# Patient Record
Sex: Female | Born: 1995 | Race: White | Hispanic: No | Marital: Single | Smoking: Former smoker
Health system: Southern US, Community
[De-identification: ages and names within clinical notes are randomized; demographics above are authoritative.]

## PROBLEM LIST (undated history)

## (undated) DIAGNOSIS — J45909 Unspecified asthma, uncomplicated: Secondary | ICD-10-CM

## (undated) DIAGNOSIS — O24419 Gestational diabetes mellitus in pregnancy, unspecified control: Secondary | ICD-10-CM

## (undated) HISTORY — PX: WISDOM TOOTH EXTRACTION: SHX21

## (undated) HISTORY — DX: Gestational diabetes mellitus in pregnancy, unspecified control: O24.419

---
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
## 2017-01-09 ENCOUNTER — Other Ambulatory Visit: Payer: Self-pay

## 2017-01-09 ENCOUNTER — Ambulatory Visit (INDEPENDENT_AMBULATORY_CARE_PROVIDER_SITE_OTHER): Payer: Medicaid Other | Admitting: Certified Nurse Midwife

## 2017-01-09 ENCOUNTER — Encounter: Payer: Self-pay | Admitting: Certified Nurse Midwife

## 2017-01-09 DIAGNOSIS — O26893 Other specified pregnancy related conditions, third trimester: Secondary | ICD-10-CM

## 2017-01-09 DIAGNOSIS — N949 Unspecified condition associated with female genital organs and menstrual cycle: Secondary | ICD-10-CM

## 2017-01-09 DIAGNOSIS — R7989 Other specified abnormal findings of blood chemistry: Secondary | ICD-10-CM

## 2017-01-09 DIAGNOSIS — Z3400 Encounter for supervision of normal first pregnancy, unspecified trimester: Secondary | ICD-10-CM

## 2017-01-09 DIAGNOSIS — O24419 Gestational diabetes mellitus in pregnancy, unspecified control: Secondary | ICD-10-CM

## 2017-01-09 MED ORDER — GLYBURIDE 2.5 MG PO TABS: 2.5000 mg | ORAL_TABLET | Freq: Every day | ORAL | 2 refills | Status: DC

## 2017-01-09 MED ORDER — COMFORT FIT MATERNITY SUPP LG MISC: 1.0000 [IU] | Freq: Every day | 0 refills | Status: DC

## 2017-01-09 NOTE — Progress Notes (Addendum)
   PRENATAL VISIT NOTE  Subjective:  Carol MixerCarrie L Lussier is a 21 y.o. G1P0 at 5835w1d being seen today for ongoing prenatal care.  She is currently monitored for the following issues for this high-risk pregnancy and has Seizure-like activity (HCC); Acanthosis nigricans; Morbid obesity (HCC); Prediabetes; Episodic tension-type headache, not intractable; Encounter for supervision of normal pregnancy, unspecified, unspecified trimester; Asthma; Low vitamin D level; Maternal varicella, non-immune; Maternal obesity affecting pregnancy, antepartum; Gestational diabetes; and Impaired glucose tolerance test on their problem list.  Patient reports backache, no bleeding, no contractions, no cramping, no leaking and pelvic pressure with walking.  Contractions: Irritability. Vag. Bleeding: None.  Movement: Present. Denies leaking of fluid.   The following portions of the patient's history were reviewed and updated as appropriate: allergies, current medications, past family history, past medical history, past social history, past surgical history and problem list. Problem list updated.  Objective:  There were no vitals filed for this visit.  Fetal Status: Fetal Heart Rate (bpm): 145; doppler Fundal Height: 34 cm Movement: Present     General:  Alert, oriented and cooperative. Patient is in no acute distress.  Skin: Skin is warm and dry. No rash noted.   Cardiovascular: Normal heart rate noted  Respiratory: Normal respiratory effort, no problems with respiration noted  Abdomen: Soft, gravid, appropriate for gestational age.  Pain/Pressure: Present     Pelvic: Cervical exam deferred        Extremities: Normal range of motion.  Edema: None  Mental Status:  Normal mood and affect. Normal behavior. Normal judgment and thought content.     CBGs    Fastings: mostly elevated: range from 82-107; 6/12 elevated >95    2 hour PP: mostly in range: 89-129; 137 & 144 X1 each  Assessment and Plan:  Pregnancy: G1P0 at  4235w1d  1. Gestational diabetes mellitus (GDM) in third trimester, gestational diabetes method of control unspecified     Started on 2.5 mg of Glyburide at HS today.  - Elastic Bandages & Supports (COMFORT FIT MATERNITY SUPP LG) MISC; 1 Units daily by Does not apply route.  Dispense: 1 each; Refill: 0 - glyBURIDE (DIABETA) 2.5 MG tablet; Take 1 tablet (2.5 mg total) at bedtime by mouth.  Dispense: 30 tablet; Refill: 2  2. Low vitamin D level     Taking weekly vitamin D  3. Supervision of high risk first pregnancy, antepartum        4. Morbid obesity (HCC)     9lb weight gain this pregnancy - Elastic Bandages & Supports (COMFORT FIT MATERNITY SUPP LG) MISC; 1 Units daily by Does not apply route.  Dispense: 1 each; Refill: 0  5. Pelvic pressure in pregnancy, antepartum, third trimester      - Elastic Bandages & Supports (COMFORT FIT MATERNITY SUPP LG) MISC; 1 Units daily by Does not apply route.  Dispense: 1 each; Refill: 0  Preterm labor symptoms and general obstetric precautions including but not limited to vaginal bleeding, contractions, leaking of fluid and fetal movement were reviewed in detail with the patient. Please refer to After Visit Summary for other counseling recommendations.  [facility] (around 01/16/2017[facility], Start Bi-weekly Antenatal testing next week with NSTs twice weekly and AFI once weekly.   Roe Coombsachelle A Danaye Sobh, CNM

## 2017-01-17 ENCOUNTER — Encounter: Payer: Self-pay | Admitting: Obstetrics and Gynecology

## 2017-01-17 ENCOUNTER — Other Ambulatory Visit: Payer: Medicaid Other

## 2017-01-17 ENCOUNTER — Ambulatory Visit (INDEPENDENT_AMBULATORY_CARE_PROVIDER_SITE_OTHER): Payer: Medicaid Other | Admitting: Obstetrics and Gynecology

## 2017-01-17 VITALS — BP 108/68 | HR 93 | Wt 277.0 lb

## 2017-01-17 DIAGNOSIS — O24415 Gestational diabetes mellitus in pregnancy, controlled by oral hypoglycemic drugs: Secondary | ICD-10-CM

## 2017-01-17 DIAGNOSIS — Z3480 Encounter for supervision of other normal pregnancy, unspecified trimester: Secondary | ICD-10-CM

## 2017-01-17 DIAGNOSIS — Z283 Underimmunization status: Secondary | ICD-10-CM

## 2017-01-17 DIAGNOSIS — O09899 Supervision of other high risk pregnancies, unspecified trimester: Secondary | ICD-10-CM

## 2017-01-17 NOTE — Progress Notes (Signed)
   PRENATAL VISIT NOTE  Subjective:  Carol Zhang is a 21 y.o. G1P0 at 5158w2d being seen today for ongoing prenatal care.  She is currently monitored for the following issues for this high-risk pregnancy and has Seizure-like activity (HCC); Acanthosis nigricans; Morbid obesity (HCC); Prediabetes; Episodic tension-type headache, not intractable; Encounter for supervision of normal pregnancy, unspecified, unspecified trimester; Asthma; Low vitamin D level; Maternal varicella, non-immune; Maternal obesity affecting pregnancy, antepartum; Gestational diabetes; and Impaired glucose tolerance test on their problem list.  Patient reports occasional contractions, nothing regular or uncomfortable.  Contractions: Irregular. Vag. Bleeding: None.  Movement: Present. Denies leaking of fluid.   Glyburide 2.5 mg FG: mostly < 95, a couple 96/97 PP: mostly < 120, a couple higher  The following portions of the patient's history were reviewed and updated as appropriate: allergies, current medications, past family history, past medical history, past social history, past surgical history and problem list. Problem list updated.  Objective:   Vitals:   01/17/17 1327 01/17/17 1344  BP: (!) 146/81 108/68  Pulse: 93   Weight: 277 lb (125.6 kg)     Fetal Status: Fetal Heart Rate (bpm): NST/AFI    Movement: Present     General:  Alert, oriented and cooperative. Patient is in no acute distress.  Skin: Skin is warm and dry. No rash noted.   Cardiovascular: Normal heart rate noted  Respiratory: Normal respiratory effort, no problems with respiration noted  Abdomen: Soft, gravid, appropriate for gestational age.  Pain/Pressure: Absent     Pelvic: Cervical exam deferred        Extremities: Normal range of motion.  Edema: None  Mental Status:  Normal mood and affect. Normal behavior. Normal judgment and thought content.   Assessment and Plan:  Pregnancy: G1P0 at 158w2d  1. Supervision of other normal pregnancy,  antepartum Undecided regarding birth control  2. Gestational diabetes mellitus (GDM) in third trimester controlled Glyburide 2.5 mg QHS Generally well controlled with occasional outlier Cont same dose, stressed importance of adhering to diet - Fetal nonstress test - US OB Limited NST reactive, AFI 14  Preterm labor symptoms and general obstetric precautions including but not limited to vaginal bleeding, contractions, leaking of fluid and fetal movement were reviewed in detail with the patient. Please refer to After Visit Summary for other counseling recommendations.  Return in about 1 week (around 01/24/2017) for OB visit (MD).   Conan BowensKelly M Davis, MD

## 2017-01-17 NOTE — Addendum Note (Signed)
Addended by: Leroy LibmanAVIS, KELLY on: 01/17/2017 02:37 PM   Modules accepted: Orders

## 2017-01-17 NOTE — Progress Notes (Signed)
Pt presents for ROB/NST/AFI dx: GDM oral meds. CBG readings available today.

## 2017-01-20 ENCOUNTER — Ambulatory Visit (INDEPENDENT_AMBULATORY_CARE_PROVIDER_SITE_OTHER): Payer: Medicaid Other

## 2017-01-20 VITALS — BP 120/72 | HR 85 | Wt 282.0 lb

## 2017-01-20 DIAGNOSIS — O24415 Gestational diabetes mellitus in pregnancy, controlled by oral hypoglycemic drugs: Secondary | ICD-10-CM

## 2017-01-20 NOTE — Progress Notes (Signed)
Pt presents for NST only today dx: GDM. Reactive - reviewed with MD

## 2017-01-24 ENCOUNTER — Other Ambulatory Visit: Payer: Medicaid Other

## 2017-01-24 ENCOUNTER — Ambulatory Visit (INDEPENDENT_AMBULATORY_CARE_PROVIDER_SITE_OTHER): Payer: Medicaid Other | Admitting: Obstetrics & Gynecology

## 2017-01-24 VITALS — BP 139/91 | HR 102 | Wt 282.0 lb

## 2017-01-24 DIAGNOSIS — O24415 Gestational diabetes mellitus in pregnancy, controlled by oral hypoglycemic drugs: Secondary | ICD-10-CM | POA: Diagnosis not present

## 2017-01-24 DIAGNOSIS — Z3400 Encounter for supervision of normal first pregnancy, unspecified trimester: Secondary | ICD-10-CM

## 2017-01-24 NOTE — Progress Notes (Signed)
   PRENATAL VISIT NOTE  Subjective:  Carol Zhang is a 21 y.o. G1P0 at 835w2d being seen today for ongoing prenatal care.  She is currently monitored for the following issues for this high-risk pregnancy and has Seizure-like activity (HCC); Acanthosis nigricans; Morbid obesity (HCC); Prediabetes; Episodic tension-type headache, not intractable; Encounter for supervision of normal pregnancy, unspecified, unspecified trimester; Asthma; Low vitamin D level; Maternal varicella, non-immune; Maternal obesity affecting pregnancy, antepartum; Gestational diabetes; and Impaired glucose tolerance test on their problem list.  Patient reports no complaints.  Contractions: Not present. Vag. Bleeding: None.  Movement: Present. Denies leaking of fluid.   The following portions of the patient's history were reviewed and updated as appropriate: allergies, current medications, past family history, past medical history, past social history, past surgical history and problem list. Problem list updated.  Objective:   Vitals:   01/24/17 0831  BP: (!) 139/91  Pulse: (!) 102  Weight: 282 lb (127.9 kg)    Fetal Status: Fetal Heart Rate (bpm): NST/AFI   Movement: Present     General:  Alert, oriented and cooperative. Patient is in no acute distress.  Skin: Skin is warm and dry. No rash noted.   Cardiovascular: Normal heart rate noted  Respiratory: Normal respiratory effort, no problems with respiration noted  Abdomen: Soft, gravid, appropriate for gestational age.  Pain/Pressure: Absent     Pelvic: Cervical exam deferred        Extremities: Normal range of motion.  Edema: None  Mental Status:  Normal mood and affect. Normal behavior. Normal judgment and thought content.   Assessment and Plan:  Pregnancy: G1P0 at 655w2d  1. Supervision of normal first pregnancy, antepartum NST today  2. Gestational diabetes mellitus (GDM) in third trimester controlled on oral hypoglycemic drug  - Fetal nonstress test -  US OB Limited  Preterm labor symptoms and general obstetric precautions including but not limited to vaginal bleeding, contractions, leaking of fluid and fetal movement were reviewed in detail with the patient. Please refer to After Visit Summary for other counseling recommendations.  F/u is scheduled, f/u US in 2 weeks  Scheryl DarterJames Arnold, MD

## 2017-01-27 ENCOUNTER — Ambulatory Visit (INDEPENDENT_AMBULATORY_CARE_PROVIDER_SITE_OTHER): Payer: Medicaid Other | Admitting: *Deleted

## 2017-01-27 DIAGNOSIS — Z3400 Encounter for supervision of normal first pregnancy, unspecified trimester: Secondary | ICD-10-CM

## 2017-01-27 DIAGNOSIS — O24415 Gestational diabetes mellitus in pregnancy, controlled by oral hypoglycemic drugs: Secondary | ICD-10-CM

## 2017-01-27 NOTE — Progress Notes (Addendum)
NST: + accels, no decels, moderate variability, Cat. 1 tracing. No contractions on toco.  Agree with nursing staff's documentation of this patient's clinic encounter.  Roe CoombsRachelle A Erasmus Bistline, CNM 01/27/2017 9:27 PM

## 2017-01-27 NOTE — Patient Instructions (Signed)

## 2017-01-27 NOTE — Progress Notes (Signed)
Pt is in office today for NST due to DM.  Pt states she is doing well today. Pt states she has had a cough recently. Pt states no other symptoms at this time. Pt advised of OTC medication that she may take during pregnancy.  Pt advised that if no relief with OTC meds to contact office for further recommendations. Pt states understanding.   NST was reviewed with R.Denney, CNM - reactive.  Pt removed from monitor and sent to check out.

## 2017-01-31 ENCOUNTER — Encounter: Payer: Medicaid Other | Admitting: Obstetrics & Gynecology

## 2017-01-31 ENCOUNTER — Ambulatory Visit: Payer: Medicaid Other

## 2017-01-31 ENCOUNTER — Ambulatory Visit (INDEPENDENT_AMBULATORY_CARE_PROVIDER_SITE_OTHER): Payer: Medicaid Other | Admitting: Obstetrics & Gynecology

## 2017-01-31 VITALS — BP 119/59 | HR 100 | Wt 283.0 lb

## 2017-01-31 DIAGNOSIS — Z3400 Encounter for supervision of normal first pregnancy, unspecified trimester: Secondary | ICD-10-CM

## 2017-01-31 DIAGNOSIS — O24415 Gestational diabetes mellitus in pregnancy, controlled by oral hypoglycemic drugs: Secondary | ICD-10-CM

## 2017-01-31 NOTE — Patient Instructions (Signed)

## 2017-01-31 NOTE — Progress Notes (Signed)
   PRENATAL VISIT NOTE  Subjective:  Carol Zhang is a 21 y.o. G1P0 at 7739w2d being seen today for ongoing prenatal care.  She is currently monitored for the following issues for this high-risk pregnancy and has Seizure-like activity (HCC); Acanthosis nigricans; Morbid obesity (HCC); Prediabetes; Episodic tension-type headache, not intractable; Encounter for supervision of normal pregnancy, unspecified, unspecified trimester; Asthma; Low vitamin D level; Maternal varicella, non-immune; Maternal obesity affecting pregnancy, antepartum; Gestational diabetes; and Impaired glucose tolerance test on their problem list.  Patient reports no complaints.  Contractions: Not present. Vag. Bleeding: None.  Movement: Present. Denies leaking of fluid.   The following portions of the patient's history were reviewed and updated as appropriate: allergies, current medications, past family history, past medical history, past social history, past surgical history and problem list. Problem list updated.  Objective:   Vitals:   01/31/17 1318  BP: (!) 119/59  Pulse: 100  Weight: 283 lb (128.4 kg)    Fetal Status: Fetal Heart Rate (bpm): NST   Movement: Present     General:  Alert, oriented and cooperative. Patient is in no acute distress.  Skin: Skin is warm and dry. No rash noted.   Cardiovascular: Normal heart rate noted  Respiratory: Normal respiratory effort, no problems with respiration noted  Abdomen: Soft, gravid, appropriate for gestational age.  Pain/Pressure: Absent     Pelvic: Cervical exam deferred        Extremities: Normal range of motion.     Mental Status:  Normal mood and affect. Normal behavior. Normal judgment and thought content.   Assessment and Plan:  Pregnancy: G1P0 at 7139w2d  1. Supervision of normal first pregnancy, antepartum NST reactive  2. Gestational diabetes mellitus (GDM) in third trimester controlled on oral hypoglycemic drug Good control on Diabeta  Preterm labor  symptoms and general obstetric precautions including but not limited to vaginal bleeding, contractions, leaking of fluid and fetal movement were reviewed in detail with the patient. Please refer to After Visit Summary for other counseling recommendations.  Return in about 1 week (around 02/07/2017).   Scheryl DarterJames Arnold, MD

## 2017-01-31 NOTE — Addendum Note (Signed)
Addended by: Pennie BanterSMITH, MARNI W on: 01/31/2017 10:28 AM   Modules accepted: Orders

## 2017-02-03 ENCOUNTER — Other Ambulatory Visit: Payer: Medicaid Other

## 2017-02-03 ENCOUNTER — Other Ambulatory Visit: Payer: Self-pay | Admitting: Obstetrics & Gynecology

## 2017-02-03 ENCOUNTER — Ambulatory Visit (HOSPITAL_COMMUNITY)
Admission: RE | Admit: 2017-02-03 | Discharge: 2017-02-03 | Disposition: A | Discharge status: Home / Self Care | Payer: Medicaid Other | Source: Ambulatory Visit | Attending: Obstetrics & Gynecology | Admitting: Obstetrics & Gynecology

## 2017-02-03 DIAGNOSIS — O24415 Gestational diabetes mellitus in pregnancy, controlled by oral hypoglycemic drugs: Secondary | ICD-10-CM

## 2017-02-03 DIAGNOSIS — Z3A35 35 weeks gestation of pregnancy: Secondary | ICD-10-CM | POA: Diagnosis not present

## 2017-02-07 ENCOUNTER — Other Ambulatory Visit (HOSPITAL_COMMUNITY)
Admission: RE | Admit: 2017-02-07 | Discharge: 2017-02-07 | Disposition: A | Discharge status: Home / Self Care | Payer: Medicaid Other | Source: Ambulatory Visit | Attending: Obstetrics & Gynecology | Admitting: Obstetrics & Gynecology

## 2017-02-07 ENCOUNTER — Ambulatory Visit (INDEPENDENT_AMBULATORY_CARE_PROVIDER_SITE_OTHER): Payer: Medicaid Other | Admitting: Obstetrics & Gynecology

## 2017-02-07 ENCOUNTER — Ambulatory Visit: Payer: Medicaid Other

## 2017-02-07 VITALS — BP 122/82 | HR 98 | Wt 280.0 lb

## 2017-02-07 DIAGNOSIS — Z3A36 36 weeks gestation of pregnancy: Secondary | ICD-10-CM

## 2017-02-07 DIAGNOSIS — O0993 Supervision of high risk pregnancy, unspecified, third trimester: Secondary | ICD-10-CM

## 2017-02-07 DIAGNOSIS — O2243 Hemorrhoids in pregnancy, third trimester: Secondary | ICD-10-CM

## 2017-02-07 DIAGNOSIS — O24415 Gestational diabetes mellitus in pregnancy, controlled by oral hypoglycemic drugs: Secondary | ICD-10-CM

## 2017-02-07 DIAGNOSIS — O2240 Hemorrhoids in pregnancy, unspecified trimester: Secondary | ICD-10-CM | POA: Insufficient documentation

## 2017-02-07 LAB — OB RESULTS CONSOLE GC/CHLAMYDIA: GC PROBE AMP, GENITAL: NEGATIVE

## 2017-02-07 LAB — OB RESULTS CONSOLE GBS: GBS: NEGATIVE

## 2017-02-07 MED ORDER — HYDROCORTISONE ACE-PRAMOXINE 1-1 % RE FOAM: 1.0000 | Freq: Two times a day (BID) | RECTAL | 0 refills | Status: DC

## 2017-02-07 NOTE — Patient Instructions (Signed)
Hemorrhoids Hemorrhoids are swollen veins in and around the rectum or anus. There are two types of hemorrhoids:  Internal hemorrhoids. These occur in the veins that are just inside the rectum. They may poke through to the outside and become irritated and painful.  External hemorrhoids. These occur in the veins that are outside of the anus and can be felt as a painful swelling or hard lump near the anus.  Most hemorrhoids do not cause serious problems, and they can be managed with home treatments such as diet and lifestyle changes. If home treatments do not help your symptoms, procedures can be done to shrink or remove the hemorrhoids. What are the causes? This condition is caused by increased pressure in the anal area. This pressure may result from various things, including:  Constipation.  Straining to have a bowel movement.  Diarrhea.  Pregnancy.  Obesity.  Sitting for long periods of time.  Heavy lifting or other activity that causes you to strain.  Anal sex.  What are the signs or symptoms? Symptoms of this condition include:  Pain.  Anal itching or irritation.  Rectal bleeding.  Leakage of stool (feces).  Anal swelling.  One or more lumps around the anus.  How is this diagnosed? This condition can often be diagnosed through a visual exam. Other exams or tests may also be done, such as:  Examination of the rectal area with a gloved hand (digital rectal exam).  Examination of the anal canal using a small tube (anoscope).  A blood test, if you have lost a significant amount of blood.  A test to look inside the colon (sigmoidoscopy or colonoscopy).  How is this treated? This condition can usually be treated at home. However, various procedures may be done if dietary changes, lifestyle changes, and other home treatments do not help your symptoms. These procedures can help make the hemorrhoids smaller or remove them completely. Some of these procedures involve  surgery, and others do not. Common procedures include:  Rubber band ligation. Rubber bands are placed at the base of the hemorrhoids to cut off the blood supply to them.  Sclerotherapy. Medicine is injected into the hemorrhoids to shrink them.  Infrared coagulation. A type of light energy is used to get rid of the hemorrhoids.  Hemorrhoidectomy surgery. The hemorrhoids are surgically removed, and the veins that supply them are tied off.  Stapled hemorrhoidopexy surgery. A circular stapling device is used to remove the hemorrhoids and use staples to cut off the blood supply to them.  Follow these instructions at home: Eating and drinking  Eat foods that have a lot of fiber in them, such as whole grains, beans, nuts, fruits, and vegetables. Ask your health care provider about taking products that have added fiber (fiber supplements).  Drink enough fluid to keep your urine clear or pale yellow. Managing pain and swelling  Take warm sitz baths for 20 minutes, 3-4 times a day to ease pain and discomfort.  If directed, apply ice to the affected area. Using ice packs between sitz baths may be helpful. ? Put ice in a plastic bag. ? Place a towel between your skin and the bag. ? Leave the ice on for 20 minutes, 2-3 times a day. General instructions  Take over-the-counter and prescription medicines only as told by your health care provider.  Use medicated creams or suppositories as told.  Exercise regularly.  Go to the bathroom when you have the urge to have a bowel movement. Do not wait.    Avoid straining to have bowel movements.  Keep the anal area dry and clean. Use wet toilet paper or moist towelettes after a bowel movement.  Do not sit on the toilet for long periods of time. This increases blood pooling and pain. Contact a health care provider if:  You have increasing pain and swelling that are not controlled by treatment or medicine.  You have uncontrolled bleeding.  You  have difficulty having a bowel movement, or you are unable to have a bowel movement.  You have pain or inflammation outside the area of the hemorrhoids. This information is not intended to replace advice given to you by your health care provider. Make sure you discuss any questions you have with your health care provider. Document Released: 02/05/2000 Document Revised: 07/08/2015 Document Reviewed: 10/22/2014 Elsevier Interactive Patient Education  2017 [employer].  

## 2017-02-07 NOTE — Progress Notes (Signed)
   PRENATAL VISIT NOTE  Subjective:  Carol Zhang is a 21 y.o. G1P0 at 6355w2d being seen today for ongoing prenatal care.  She is currently monitored for the following issues for this high-risk pregnancy and has Seizure-like activity (HCC); Acanthosis nigricans; Morbid obesity (HCC); Prediabetes; Episodic tension-type headache, not intractable; Encounter for supervision of normal pregnancy, unspecified, unspecified trimester; Asthma; Low vitamin D level; Maternal varicella, non-immune; Maternal obesity affecting pregnancy, antepartum; Gestational diabetes; Impaired glucose tolerance test; and Hemorrhoids during pregnancy on their problem list.  Patient reports painful hemorrhoids not controlled with OTC preparation H.  Contractions: Irregular. Vag. Bleeding: None.  Movement: Present. Denies leaking of fluid.   The following portions of the patient's history were reviewed and updated as appropriate: allergies, current medications, past family history, past medical history, past social history, past surgical history and problem list. Problem list updated.  Objective:   Vitals:   02/07/17 0857  BP: 122/82  Pulse: 98  Weight: 280 lb (127 kg)    Fetal Status:     Movement: Present     General:  Alert, oriented and cooperative. Patient is in no acute distress.  Skin: Skin is warm and dry. No rash noted.   Cardiovascular: Normal heart rate noted  Respiratory: Normal respiratory effort, no problems with respiration noted  Abdomen: Soft, gravid, appropriate for gestational age.  Pain/Pressure: Present     Pelvic: Cervical exam deferred        Extremities: Normal range of motion.  Edema: Trace  Mental Status:  Normal mood and affect. Normal behavior. Normal judgment and thought content.   Assessment and Plan:  Pregnancy: G1P0 at 7555w2d  1. Gestational diabetes mellitus (GDM) in third trimester controlled on oral hypoglycemic drug Report nl FBS and lunchtime values may be 130's - Fetal  nonstress test - US OB Limited  2. Supervision of high risk pregnancy, antepartum, third trimester routine testing - Strep Gp B NAA - Cervicovaginal ancillary only  3. Hemorrhoids during pregnancy in third trimester  - hydrocortisone-pramoxine (PROCTOFOAM HC) rectal foam; Place 1 applicator rectally 2 (two) times daily.  Dispense: 10 g; Refill: 0  Preterm labor symptoms and general obstetric precautions including but not limited to vaginal bleeding, contractions, leaking of fluid and fetal movement were reviewed in detail with the patient. Please refer to After Visit Summary for other counseling recommendations.  Return in about 1 week (around 02/14/2017) for visit and NST AFI.   Scheryl DarterJames Welford Christmas, MD

## 2017-02-08 LAB — CERVICOVAGINAL ANCILLARY ONLY
BACTERIAL VAGINITIS: NEGATIVE
CANDIDA VAGINITIS: NEGATIVE
CHLAMYDIA, DNA PROBE: NEGATIVE
Neisseria Gonorrhea: NEGATIVE
Trichomonas: NEGATIVE

## 2017-02-09 ENCOUNTER — Ambulatory Visit (HOSPITAL_COMMUNITY)
Admission: RE | Admit: 2017-02-09 | Discharge: 2017-02-09 | Disposition: A | Discharge status: Home / Self Care | Payer: Medicaid Other | Source: Ambulatory Visit | Attending: Obstetrics and Gynecology | Admitting: Obstetrics and Gynecology

## 2017-02-09 DIAGNOSIS — Z362 Encounter for other antenatal screening follow-up: Secondary | ICD-10-CM | POA: Insufficient documentation

## 2017-02-09 DIAGNOSIS — Z3A36 36 weeks gestation of pregnancy: Secondary | ICD-10-CM | POA: Insufficient documentation

## 2017-02-09 DIAGNOSIS — O24415 Gestational diabetes mellitus in pregnancy, controlled by oral hypoglycemic drugs: Secondary | ICD-10-CM | POA: Insufficient documentation

## 2017-02-09 LAB — STREP GP B NAA: Strep Gp B NAA: NEGATIVE

## 2017-02-10 ENCOUNTER — Ambulatory Visit (INDEPENDENT_AMBULATORY_CARE_PROVIDER_SITE_OTHER): Payer: Medicaid Other

## 2017-02-10 DIAGNOSIS — O24415 Gestational diabetes mellitus in pregnancy, controlled by oral hypoglycemic drugs: Secondary | ICD-10-CM | POA: Diagnosis not present

## 2017-02-10 NOTE — Progress Notes (Signed)
Nurse visit for NST dx: GDM - oral meds

## 2017-02-15 ENCOUNTER — Ambulatory Visit (INDEPENDENT_AMBULATORY_CARE_PROVIDER_SITE_OTHER): Payer: Medicaid Other | Admitting: Obstetrics & Gynecology

## 2017-02-15 ENCOUNTER — Other Ambulatory Visit: Payer: Medicaid Other

## 2017-02-15 VITALS — BP 131/78 | HR 90 | Wt 290.0 lb

## 2017-02-15 DIAGNOSIS — O24415 Gestational diabetes mellitus in pregnancy, controlled by oral hypoglycemic drugs: Secondary | ICD-10-CM

## 2017-02-16 NOTE — Patient Instructions (Signed)

## 2017-02-17 ENCOUNTER — Ambulatory Visit: Payer: Medicaid Other

## 2017-02-17 DIAGNOSIS — O24415 Gestational diabetes mellitus in pregnancy, controlled by oral hypoglycemic drugs: Secondary | ICD-10-CM

## 2017-02-17 NOTE — Progress Notes (Signed)
Nurse visit for NST Dx, GDM Reactive Per Dr.Arnold.

## 2017-02-21 NOTE — L&D Delivery Note (Signed)
Patient is 22 y.o. G1P0 5077w1d admitted for IOL due to A2GDM.  S/p IOL with foley bulb, cytotec, followed by Pitocin. AROM at 0300.  Prenatal course also complicated by LGA with EFW 3742g, AC > 97%tile.   Delivery Note At 8:19 PM a viable female was delivered via Vaginal, Spontaneous (Presentation: LOA).  APGAR: 4, 9; weight pending.   Placenta status: spontaneous, intact.  Cord: 3-vessel.   Anesthesia:  Epidural, Lidocaine  Episiotomy: None Lacerations: Labial;Sulcus Suture Repair: 3.0 Est. Blood Loss (mL):  300 cc   Head delivered LOA. No nuchal cord present. Shoulder and body delivered in usual fashion. Infant with spontaneous cry, placed on mother's abdomen, dried and bulb suctioned. Cord clamped x 2 after 1-minute delay, and cut by family member. Cord blood drawn. Placenta delivered spontaneously with gentle cord traction. Fundus firm with massage and Pitocin. Perineum inspected and found to have labial and sulcal lacerations, which was repaired with good hemostasis.   Mom to postpartum.  Baby to Couplet care / Skin to Skin.  Freddrick MarchYashika Aztlan Coll, MD  02/27/2017, 8:59 PM

## 2017-02-22 ENCOUNTER — Other Ambulatory Visit: Payer: Medicaid Other

## 2017-02-22 ENCOUNTER — Telehealth (HOSPITAL_COMMUNITY): Payer: Self-pay | Admitting: *Deleted

## 2017-02-22 ENCOUNTER — Ambulatory Visit (INDEPENDENT_AMBULATORY_CARE_PROVIDER_SITE_OTHER): Payer: Medicaid Other | Admitting: Obstetrics and Gynecology

## 2017-02-22 ENCOUNTER — Encounter: Payer: Self-pay | Admitting: Obstetrics and Gynecology

## 2017-02-22 ENCOUNTER — Encounter (HOSPITAL_COMMUNITY): Payer: Self-pay | Admitting: *Deleted

## 2017-02-22 VITALS — BP 139/82 | HR 103 | Wt 292.7 lb

## 2017-02-22 DIAGNOSIS — O0993 Supervision of high risk pregnancy, unspecified, third trimester: Secondary | ICD-10-CM

## 2017-02-22 DIAGNOSIS — O99210 Obesity complicating pregnancy, unspecified trimester: Secondary | ICD-10-CM

## 2017-02-22 DIAGNOSIS — O24415 Gestational diabetes mellitus in pregnancy, controlled by oral hypoglycemic drugs: Secondary | ICD-10-CM | POA: Diagnosis not present

## 2017-02-22 DIAGNOSIS — E669 Obesity, unspecified: Secondary | ICD-10-CM

## 2017-02-22 DIAGNOSIS — O99213 Obesity complicating pregnancy, third trimester: Secondary | ICD-10-CM

## 2017-02-22 NOTE — Telephone Encounter (Signed)
Preadmission screen  

## 2017-02-22 NOTE — Progress Notes (Signed)
   PRENATAL VISIT NOTE  Subjective:  Carol Zhang is a 22 y.o. G1P0 at 469w3d being seen today for ongoing prenatal care.  She is currently monitored for the following issues for this high-risk pregnancy and has Seizure-like activity (HCC); Acanthosis nigricans; Morbid obesity (HCC); Prediabetes; Episodic tension-type headache, not intractable; Supervision of high risk pregnancy, antepartum, third trimester; Asthma; Low vitamin D level; Maternal varicella, non-immune; Maternal obesity affecting pregnancy, antepartum; Gestational diabetes; Impaired glucose tolerance test; and Hemorrhoids during pregnancy on their problem list.  Patient reports no complaints.  Contractions: Irregular. Vag. Bleeding: None.  Movement: Present. Denies leaking of fluid.   The following portions of the patient's history were reviewed and updated as appropriate: allergies, current medications, past family history, past medical history, past social history, past surgical history and problem list. Problem list updated.  Objective:   Vitals:   02/22/17 0854  BP: 139/82  Pulse: (!) 103  Weight: 292 lb 11.2 oz (132.8 kg)    Fetal Status: Fetal Heart Rate (bpm): NST/AFI   Movement: Present     General:  Alert, oriented and cooperative. Patient is in no acute distress.  Skin: Skin is warm and dry. No rash noted.   Cardiovascular: Normal heart rate noted  Respiratory: Normal respiratory effort, no problems with respiration noted  Abdomen: Soft, gravid, appropriate for gestational age.  Pain/Pressure: Absent     Pelvic: Cervical exam deferred        Extremities: Normal range of motion.  Edema: Trace  Mental Status:  Normal mood and affect. Normal behavior. Normal judgment and thought content.   Assessment and Plan:  Pregnancy: G1P0 at 629w3d  1. Supervision of high risk pregnancy, antepartum, third trimester Patient is doing well without complaints Patient plans IUD for contraception - Fetal nonstress test - US  OB Limited; Future  2. Gestational diabetes mellitus (GDM) in third trimester controlled on oral hypoglycemic drug CBGs reviewed and well controlled on glyburide 2.5 mg qHS with 2 pp 130 12/20 EFW 3742 gm with fetus in breech presentation Plan for delivery at 39 weeks (vertex today - Fetal nonstress test- reviewed and reactive with baseline 150, mod variability, + accels, no decels - US OB Limited; Future  3. Maternal obesity affecting pregnancy, antepartum   Term labor symptoms and general obstetric precautions including but not limited to vaginal bleeding, contractions, leaking of fluid and fetal movement were reviewed in detail with the patient. Please refer to After Visit Summary for other counseling recommendations.  Return in about 4 weeks (around 03/22/2017) for postpartum visit and IUD insertion.   Catalina AntiguaPeggy Felisia Balcom, MD

## 2017-02-22 NOTE — Patient Instructions (Signed)
Cesarean Delivery Cesarean birth, or cesarean delivery, is the surgical delivery of a baby through an incision in the abdomen and the uterus. This may be referred to as a C-section. This procedure may be scheduled ahead of time, or it may be done in an emergency situation. Tell a health care provider about:  Any allergies you have.  All medicines you are taking, including vitamins, herbs, eye drops, creams, and over-the-counter medicines.  Any problems you or family members have had with anesthetic medicines.  Any blood disorders you have.  Any surgeries you have had.  Any medical conditions you have.  Whether you or any members of your family have a history of deep vein thrombosis (DVT) or pulmonary embolism (PE). What are the risks? Generally, this is a safe procedure. However, problems may occur, including:  Infection.  Bleeding.  Allergic reactions to medicines.  Damage to other structures or organs.  Blood clots.  Injury to your baby.  What happens before the procedure?  Follow instructions from your health care provider about eating or drinking restrictions.  Follow instructions from your health care provider about bathing before your procedure to help reduce your risk of infection.  If you know that you are going to have a cesarean delivery, do not shave your pubic area. Shaving before the procedure may increase your risk of infection.  Ask your health care provider about: ? Changing or stopping your regular medicines. This is especially important if you are taking diabetes medicines or blood thinners. ? Your pain management plan. This is especially important if you plan to breastfeed your baby. ? How long you will be in the hospital after the procedure. ? Any concerns you may have about receiving blood products if you need them during the procedure. ? Cord blood banking, if you plan to collect your baby's umbilical cord blood.  You may also want to ask your  health care provider: ? Whether you will be able to hold or breastfeed your baby while you are still in the operating room. ? Whether your baby can stay with you immediately after the procedure and during your recovery. ? Whether a family member or a person of your choice can go with you into the operating room and stay with you during the procedure, immediately after the procedure, and during your recovery.  Plan to have someone drive you home when you are discharged from the hospital. What happens during the procedure?  Fetal monitors will be placed on your abdomen to monitor your heart rate and your baby's heart rate.  Depending on the reason for your cesarean delivery, you may have a physical exam or additional testing, such as an ultrasound.  An IV tube will be inserted into one of your veins.  You may have your blood or urine tested.  You will be given antibiotic medicine to help prevent infection.  You may be given a special warming gown to wear to keep your temperature stable.  Hair may be removed from your pubic area.  The skin of your pubic area and lower abdomen will be cleaned with a germ-killing solution (antiseptic).  A catheter may be inserted into your bladder through your urethra. This drains your urine during the procedure.  You may be given one or more of the following: ? A medicine to numb the area (local anesthetic). ? A medicine to make you fall asleep (general anesthetic). ? A medicine (regional anesthetic) that is injected into your back or through a small   thin tube placed in your back (spinal anesthetic or epidural anesthetic). This numbs everything below the injection site and allows you to stay awake during your procedure. If this makes you feel nauseous, tell your health care provider. Medicines will be available to help reduce any nausea you may feel.  An incision will be made in your abdomen, and then in your uterus.  If you are awake during your  procedure, you may feel tugging and pulling in your abdomen, but you should not feel pain. If you feel pain, tell your health care provider immediately.  Your baby will be removed from your uterus. You may feel more pressure or pushing while this happens.  Immediately after birth, your baby will be dried and kept warm. You may be able to hold and breastfeed your baby. The umbilical cord may be clamped and cut during this time.  Your placenta will be removed from your uterus.  Your incisions will be closed with stitches (sutures). Staples, skin glue, or adhesive strips may also be applied to the incision in your abdomen.  Bandages (dressings) will be placed over the incision in your abdomen. The procedure may vary among health care providers and hospitals. What happens after the procedure?  Your blood pressure, heart rate, breathing rate, and blood oxygen level will be monitored often until the medicines you were given have worn off.  You may continue to receive fluids and medicines through an IV tube.  You will have some pain. Medicines will be available to help control your pain.  To help prevent blood clots: ? You may be given medicines. ? You may have to wear compression stockings or devices. ? You will be encouraged to walk around when you are able.  Hospital staff will encourage and support bonding with your baby. Your hospital may allow you and your baby to stay in the same room (rooming in) during your hospital stay to encourage successful breastfeeding.  You may be encouraged to cough and breathe deeply often. This helps to prevent lung problems.  If you have a catheter draining your urine, it will be removed as soon as possible after your procedure. This information is not intended to replace advice given to you by your health care provider. Make sure you discuss any questions you have with your health care provider.   Intrauterine Device Information An intrauterine device  (IUD) is inserted into your uterus to prevent pregnancy. There are two types of IUDs available:  Copper IUD-This type of IUD is wrapped in copper wire and is placed inside the uterus. Copper makes the uterus and fallopian tubes produce a fluid that kills sperm. The copper IUD can stay in place for 10 years.  Hormone IUD-This type of IUD contains the hormone progestin (synthetic progesterone). The hormone thickens the cervical mucus and prevents sperm from entering the uterus. It also thins the uterine lining to prevent implantation of a fertilized egg. The hormone can weaken or kill the sperm that get into the uterus. One type of hormone IUD can stay in place for 5 years, and another type can stay in place for 3 years.  Your health care provider will make sure you are a good candidate for a contraceptive IUD. Discuss with your health care provider the possible side effects. Advantages of an intrauterine device  IUDs are highly effective, reversible, long acting, and low maintenance.  There are no estrogen-related side effects.  An IUD can be used when breastfeeding.  IUDs are not associated  with weight gain.  The copper IUD works immediately after insertion.  The hormone IUD works right away if inserted within 7 days of your period starting. You will need to use a backup method of birth control for 7 days if the hormone IUD is inserted at any other time in your cycle.  The copper IUD does not interfere with your female hormones.  The hormone IUD can make heavy menstrual periods lighter and decrease cramping.  The hormone IUD can be used for 3 or 5 years.  The copper IUD can be used for 10 years. Disadvantages of an intrauterine device  The hormone IUD can be associated with irregular bleeding patterns.  The copper IUD can make your menstrual flow heavier and more painful.  You may experience cramping and vaginal bleeding after insertion. This information is not intended to replace  advice given to you by your health care provider. Make sure you discuss any questions you have with your health care provider. Document Released: 01/12/2004 Document Revised: 07/16/2015 Document Reviewed: 07/29/2012 Elsevier Interactive Patient Education  2017 Elsevier Inc.  Document Released: 02/07/2005 Document Revised: 07/16/2015 Document Reviewed: 11/18/2014 Elsevier Interactive Patient Education  2018 [employer].

## 2017-02-24 ENCOUNTER — Other Ambulatory Visit: Payer: Self-pay | Admitting: Advanced Practice Midwife

## 2017-02-24 ENCOUNTER — Other Ambulatory Visit: Payer: Self-pay

## 2017-02-24 ENCOUNTER — Ambulatory Visit (INDEPENDENT_AMBULATORY_CARE_PROVIDER_SITE_OTHER): Payer: Medicaid Other

## 2017-02-24 DIAGNOSIS — O24415 Gestational diabetes mellitus in pregnancy, controlled by oral hypoglycemic drugs: Secondary | ICD-10-CM | POA: Diagnosis not present

## 2017-02-24 DIAGNOSIS — O0993 Supervision of high risk pregnancy, unspecified, third trimester: Secondary | ICD-10-CM

## 2017-02-24 NOTE — Progress Notes (Signed)
NST reactive EFM: Baseline: 135 bpm, Variability: Good {> 6 bpm), Accelerations: Reactive and Decelerations: Absent Toco: None  IOL scheduled for 02/27/16.  Katrinka BlazingSmith, [place], CNM 02/24/2017 9:15 AM

## 2017-02-24 NOTE — Patient Instructions (Signed)
Labor Induction Labor induction is when steps are taken to cause a pregnant woman to begin the labor process. Most women go into labor on their own between 37 weeks and 42 weeks of the pregnancy. When this does not happen or when there is a medical need, methods may be used to induce labor. Labor induction causes a pregnant woman's uterus to contract. It also causes the cervix to soften (ripen), open (dilate), and thin out (efface). Usually, labor is not induced before 39 weeks of the pregnancy unless there is a problem with the baby or mother. Before inducing labor, your health care provider will consider a number of factors, including the following:  The medical condition of you and the baby.  How many weeks along you are.  The status of the baby's lung maturity.  The condition of the cervix.  The position of the baby. What are the reasons for labor induction? Labor may be induced for the following reasons:  The health of the baby or mother is at risk.  The pregnancy is overdue by 1 week or more.  The water breaks but labor does not start on its own.  The mother has a health condition or serious illness, such as high blood pressure, infection, placental abruption, or diabetes.  The amniotic fluid amounts are low around the baby.  The baby is distressed. Convenience or wanting the baby to be born on a certain date is not a reason for inducing labor. What methods are used for labor induction? Several methods of labor induction may be used, such as:  Prostaglandin medicine. This medicine causes the cervix to dilate and ripen. The medicine will also start contractions. It can be taken by mouth or by inserting a suppository into the vagina.  Inserting a thin tube (catheter) with a balloon on the end into the vagina to dilate the cervix. Once inserted, the balloon is expanded with water, which causes the cervix to open.  Stripping the membranes. Your health care provider separates  amniotic sac tissue from the cervix, causing the cervix to be stretched and causing the release of a hormone called progesterone. This may cause the uterus to contract. It is often done during an office visit. You will be sent home to wait for the contractions to begin. You will then come in for an induction.  Breaking the water. Your health care provider makes a hole in the amniotic sac using a small instrument. Once the amniotic sac breaks, contractions should begin. This may still take hours to see an effect.  Medicine to trigger or strengthen contractions. This medicine is given through an IV access tube inserted into a vein in your arm. All of the methods of induction, besides stripping the membranes, will be done in the hospital. Induction is done in the hospital so that you and the baby can be carefully monitored. How long does it take for labor to be induced? Some inductions can take up to 2-3 days. Depending on the cervix, it usually takes less time. It takes longer when you are induced early in the pregnancy or if this is your first pregnancy. If a mother is still pregnant and the induction has been going on for 2-3 days, either the mother will be sent home or a cesarean delivery will be needed. What are the risks associated with labor induction? Some of the risks of induction include:  Changes in fetal heart rate, such as too high, too low, or erratic.  Fetal distress.    Chance of infection for the mother and baby.  Increased chance of having a cesarean delivery.  Breaking off (abruption) of the placenta from the uterus (rare).  Uterine rupture (very rare). When induction is needed for medical reasons, the benefits of induction may outweigh the risks. What are some reasons for not inducing labor? Labor induction should not be done if:  It is shown that your baby does not tolerate labor.  You have had previous surgeries on your uterus, such as a myomectomy or the removal of  fibroids.  Your placenta lies very low in the uterus and blocks the opening of the cervix (placenta previa).  Your baby is not in a head-down position.  The umbilical cord drops down into the birth canal in front of the baby. This could cut off the baby's blood and oxygen supply.  You have had a previous cesarean delivery.  There are unusual circumstances, such as the baby being extremely premature. This information is not intended to replace advice given to you by your health care provider. Make sure you discuss any questions you have with your health care provider. Document Released: 06/29/2006 Document Revised: 07/16/2015 Document Reviewed: 09/06/2012 Elsevier Interactive Patient Education  2017 Elsevier Inc.  

## 2017-02-24 NOTE — Progress Notes (Signed)
Presents for NST. NST Reative

## 2017-02-26 ENCOUNTER — Inpatient Hospital Stay (HOSPITAL_COMMUNITY): Payer: Medicaid Other | Admitting: Anesthesiology

## 2017-02-26 ENCOUNTER — Encounter (HOSPITAL_COMMUNITY): Payer: Self-pay

## 2017-02-26 ENCOUNTER — Inpatient Hospital Stay (HOSPITAL_COMMUNITY)
Admission: RE | Admit: 2017-02-26 | Discharge: 2017-03-01 | DRG: 806 | Disposition: A | Discharge status: Home / Self Care | Payer: Medicaid Other | Source: Ambulatory Visit | Attending: Obstetrics and Gynecology | Admitting: Obstetrics and Gynecology

## 2017-02-26 DIAGNOSIS — O24425 Gestational diabetes mellitus in childbirth, controlled by oral hypoglycemic drugs: Principal | ICD-10-CM | POA: Diagnosis present

## 2017-02-26 DIAGNOSIS — Z3A39 39 weeks gestation of pregnancy: Secondary | ICD-10-CM

## 2017-02-26 DIAGNOSIS — O3663X0 Maternal care for excessive fetal growth, third trimester, not applicable or unspecified: Secondary | ICD-10-CM | POA: Diagnosis present

## 2017-02-26 DIAGNOSIS — O99214 Obesity complicating childbirth: Secondary | ICD-10-CM | POA: Diagnosis present

## 2017-02-26 DIAGNOSIS — O2243 Hemorrhoids in pregnancy, third trimester: Secondary | ICD-10-CM | POA: Diagnosis present

## 2017-02-26 DIAGNOSIS — O0993 Supervision of high risk pregnancy, unspecified, third trimester: Secondary | ICD-10-CM

## 2017-02-26 DIAGNOSIS — O99210 Obesity complicating pregnancy, unspecified trimester: Secondary | ICD-10-CM

## 2017-02-26 DIAGNOSIS — R569 Unspecified convulsions: Secondary | ICD-10-CM

## 2017-02-26 DIAGNOSIS — Z87891 Personal history of nicotine dependence: Secondary | ICD-10-CM | POA: Diagnosis not present

## 2017-02-26 DIAGNOSIS — O24419 Gestational diabetes mellitus in pregnancy, unspecified control: Secondary | ICD-10-CM | POA: Diagnosis present

## 2017-02-26 DIAGNOSIS — O9952 Diseases of the respiratory system complicating childbirth: Secondary | ICD-10-CM | POA: Diagnosis present

## 2017-02-26 DIAGNOSIS — J45909 Unspecified asthma, uncomplicated: Secondary | ICD-10-CM | POA: Diagnosis present

## 2017-02-26 DIAGNOSIS — O24429 Gestational diabetes mellitus in childbirth, unspecified control: Secondary | ICD-10-CM | POA: Diagnosis not present

## 2017-02-26 LAB — CBC
HEMATOCRIT: 31.4 % — AB (ref 36.0–46.0)
Hemoglobin: 10.3 g/dL — ABNORMAL LOW (ref 12.0–15.0)
MCH: 26.6 pg (ref 26.0–34.0)
MCHC: 32.8 g/dL (ref 30.0–36.0)
MCV: 81.1 fL (ref 78.0–100.0)
Platelets: 304 10*3/uL (ref 150–400)
RBC: 3.87 MIL/uL (ref 3.87–5.11)
RDW: 14.3 % (ref 11.5–15.5)
WBC: 11.6 10*3/uL — ABNORMAL HIGH (ref 4.0–10.5)

## 2017-02-26 LAB — GLUCOSE, CAPILLARY
GLUCOSE-CAPILLARY: 93 mg/dL (ref 65–99)
GLUCOSE-CAPILLARY: 79 mg/dL (ref 65–99)
GLUCOSE-CAPILLARY: 90 mg/dL (ref 65–99)

## 2017-02-26 LAB — ABO/RH: ABO/RH(D): O POS

## 2017-02-26 LAB — TYPE AND SCREEN
ABO/RH(D): O POS
ANTIBODY SCREEN: NEGATIVE

## 2017-02-26 LAB — GLUCOSE, RANDOM: Glucose, Bld: 101 mg/dL — ABNORMAL HIGH (ref 65–99)

## 2017-02-26 MED ORDER — OXYCODONE-ACETAMINOPHEN 5-325 MG PO TABS: 1.0000 | ORAL_TABLET | ORAL | Status: DC | PRN

## 2017-02-26 MED ORDER — DIPHENHYDRAMINE HCL 50 MG/ML IJ SOLN: 12.5000 mg | INTRAMUSCULAR | Status: DC | PRN

## 2017-02-26 MED ORDER — LACTATED RINGERS IV SOLN
500.0000 mL | Freq: Once | INTRAVENOUS | Status: AC
  Administered 2017-02-26: 500 mL via INTRAVENOUS

## 2017-02-26 MED ORDER — OXYCODONE-ACETAMINOPHEN 5-325 MG PO TABS: 2.0000 | ORAL_TABLET | ORAL | Status: DC | PRN

## 2017-02-26 MED ORDER — ACETAMINOPHEN 325 MG PO TABS
650.0000 mg | ORAL_TABLET | ORAL | Status: DC | PRN
  Administered 2017-02-27: 650 mg via ORAL
  Filled 2017-02-26: qty 2

## 2017-02-26 MED ORDER — LIDOCAINE HCL (PF) 1 % IJ SOLN
30.0000 mL | INTRAMUSCULAR | Status: DC | PRN
  Administered 2017-02-27: 30 mL via SUBCUTANEOUS
  Filled 2017-02-26: qty 30

## 2017-02-26 MED ORDER — EPHEDRINE 5 MG/ML INJ
10.0000 mg | INTRAVENOUS | Status: DC | PRN
  Filled 2017-02-26: qty 2

## 2017-02-26 MED ORDER — PHENYLEPHRINE 40 MCG/ML (10ML) SYRINGE FOR IV PUSH (FOR BLOOD PRESSURE SUPPORT)
80.0000 ug | PREFILLED_SYRINGE | INTRAVENOUS | Status: DC | PRN
  Filled 2017-02-26: qty 5
  Filled 2017-02-26: qty 10

## 2017-02-26 MED ORDER — TERBUTALINE SULFATE 1 MG/ML IJ SOLN: 0.2500 mg | Freq: Once | INTRAMUSCULAR | Status: DC | PRN

## 2017-02-26 MED ORDER — FENTANYL CITRATE (PF) 100 MCG/2ML IJ SOLN
100.0000 ug | INTRAMUSCULAR | Status: DC | PRN
  Administered 2017-02-26: 100 ug via INTRAVENOUS
  Filled 2017-02-26: qty 2

## 2017-02-26 MED ORDER — SOD CITRATE-CITRIC ACID 500-334 MG/5ML PO SOLN: 30.0000 mL | ORAL | Status: DC | PRN

## 2017-02-26 MED ORDER — PHENYLEPHRINE 40 MCG/ML (10ML) SYRINGE FOR IV PUSH (FOR BLOOD PRESSURE SUPPORT)
80.0000 ug | PREFILLED_SYRINGE | INTRAVENOUS | Status: DC | PRN
  Filled 2017-02-26: qty 5

## 2017-02-26 MED ORDER — OXYTOCIN BOLUS FROM INFUSION
500.0000 mL | Freq: Once | INTRAVENOUS | Status: AC
  Administered 2017-02-27: 500 mL via INTRAVENOUS

## 2017-02-26 MED ORDER — LIDOCAINE HCL (PF) 1 % IJ SOLN
INTRAMUSCULAR | Status: DC | PRN
  Administered 2017-02-26 (×2): 5 mL

## 2017-02-26 MED ORDER — LACTATED RINGERS IV SOLN
INTRAVENOUS | Status: DC
  Administered 2017-02-27: 12:00:00 via INTRAVENOUS

## 2017-02-26 MED ORDER — LACTATED RINGERS IV SOLN
500.0000 mL | INTRAVENOUS | Status: DC | PRN
  Administered 2017-02-27: 500 mL via INTRAVENOUS

## 2017-02-26 MED ORDER — TERBUTALINE SULFATE 1 MG/ML IJ SOLN
0.2500 mg | Freq: Once | INTRAMUSCULAR | Status: DC | PRN
  Filled 2017-02-26: qty 1

## 2017-02-26 MED ORDER — OXYTOCIN 40 UNITS IN LACTATED RINGERS INFUSION - SIMPLE MED
2.5000 [IU]/h | INTRAVENOUS | Status: DC
  Administered 2017-02-27: 2.5 [IU]/h via INTRAVENOUS
  Filled 2017-02-26: qty 1000

## 2017-02-26 MED ORDER — OXYTOCIN 40 UNITS IN LACTATED RINGERS INFUSION - SIMPLE MED
1.0000 m[IU]/min | INTRAVENOUS | Status: DC
  Administered 2017-02-26: 2 m[IU]/min via INTRAVENOUS
  Administered 2017-02-27: 6 m[IU]/min via INTRAVENOUS
  Administered 2017-02-27: 8 m[IU]/min via INTRAVENOUS
  Administered 2017-02-27: 2 m[IU]/min via INTRAVENOUS
  Administered 2017-02-27: 4 m[IU]/min via INTRAVENOUS

## 2017-02-26 MED ORDER — FENTANYL 2.5 MCG/ML BUPIVACAINE 1/10 % EPIDURAL INFUSION (WH - ANES)
14.0000 mL/h | INTRAMUSCULAR | Status: DC | PRN
  Administered 2017-02-26 – 2017-02-27 (×4): 14 mL/h via EPIDURAL
  Filled 2017-02-26 (×4): qty 100

## 2017-02-26 MED ORDER — ONDANSETRON HCL 4 MG/2ML IJ SOLN
4.0000 mg | Freq: Four times a day (QID) | INTRAMUSCULAR | Status: DC | PRN
  Administered 2017-02-27: 4 mg via INTRAVENOUS
  Filled 2017-02-26: qty 2

## 2017-02-26 MED ORDER — MISOPROSTOL 25 MCG QUARTER TABLET
25.0000 ug | ORAL_TABLET | ORAL | Status: DC | PRN
  Administered 2017-02-26: 25 ug via VAGINAL
  Filled 2017-02-26 (×3): qty 1

## 2017-02-26 NOTE — Anesthesia Preprocedure Evaluation (Signed)
Anesthesia Evaluation  Patient identified by MRN, date of birth, ID band Patient awake    Reviewed: Allergy & Precautions, H&P , NPO status , Patient's Chart, lab work & pertinent test results  History of Anesthesia Complications Negative for: history of anesthetic complications  Airway Mallampati: II  TM Distance: >3 FB Neck ROM: full    Dental no notable dental hx. (+) Teeth Intact   Pulmonary neg pulmonary ROS, former smoker,    Pulmonary exam normal breath sounds clear to auscultation       Cardiovascular negative cardio ROS Normal cardiovascular exam Rhythm:regular Rate:Normal     Neuro/Psych negative neurological ROS  negative psych ROS   GI/Hepatic negative GI ROS, Neg liver ROS,   Endo/Other  diabetes, GestationalMorbid obesity  Renal/GU negative Renal ROS  negative genitourinary   Musculoskeletal negative musculoskeletal ROS (+)   Abdominal   Peds negative pediatric ROS (+)  Hematology negative hematology ROS (+)   Anesthesia Other Findings   Reproductive/Obstetrics (+) Pregnancy                             Anesthesia Physical Anesthesia Plan  ASA: III  Anesthesia Plan: Epidural   Post-op Pain Management:    Induction:   PONV Risk Score and Plan:   Airway Management Planned:   Additional Equipment:   Intra-op Plan:   Post-operative Plan:   Informed Consent: I have reviewed the patients History and Physical, chart, labs and discussed the procedure including the risks, benefits and alternatives for the proposed anesthesia with the patient or authorized representative who has indicated his/her understanding and acceptance.     Plan Discussed with:   Anesthesia Plan Comments:         Anesthesia Quick Evaluation

## 2017-02-26 NOTE — Anesthesia Pain Management Evaluation Note (Signed)
  [facility] Pain Management Visit Note  Patient: Carol Zhang, 22 y.o., female  "Hello I am a [facility]. We have an anesthesia team available at all times to provide care throughout the hospital, including epidural management and anesthesia for C-section. I don't know your plan for the delivery whether it a natural birth, water birth, IV sedation, nitrous supplementation, doula or epidural, but we want to meet your pain goals."   1.Was your pain managed to your expectations on prior hospitalizations?   No prior hospitalizations  2.What is your expectation for pain management during this hospitalization?     Labor support without medications  3.How can we help you reach that goal?   Record the patient's initial score and the patient's pain goal.   Pain: 0  Pain Goal: 7 [facility] wants you to be able to say your pain was always managed very well.  Carol Zhang,Carol Zhang 02/26/2017

## 2017-02-26 NOTE — Progress Notes (Signed)
Carol Zhang is a 22 y.o. G1P0 at 5969w0d by ultrasound admitted for induction of labor due to Gestational diabetes.  Subjective: Patient doing well with no concerns at this time. States contractions are getting stronger. States she now desires an epidural for pain control. .   Objective: BP 126/73   Pulse 79   Temp 98.2 F (36.8 C) (Oral)   Resp 20   Ht 5\' 3"  (1.6 m)   Wt 133.4 kg (294 lb 0.6 oz)   LMP 05/18/2016 (Approximate)   BMI 52.09 kg/m  No intake/output data recorded. No intake/output data recorded.  FHT:  FHR: 140 bpm, variability: moderate,  accelerations:  Present,  decelerations:  Present early UC:   irregular, every 2-3 minutes SVE:   Dilation: 2.5 Effacement (%): 80 Station: Ballotable Exam by:: J.Thornton, RN   Labs: Lab Results  Component Value Date   WBC 11.6 (H) 02/26/2017   HGB 10.3 (L) 02/26/2017   HCT 31.4 (L) 02/26/2017   MCV 81.1 02/26/2017   PLT 304 02/26/2017    Assessment / Plan: Induction of labor due to gestational diabetes,  progressing well on pitocin  Labor: Progressing on Pitocin, will continue to increase then AROM Fetal Wellbeing:  Category I Pain Control:  Epidural I/D:  n/a Anticipated MOD:  NSVD  Carol Zhang 02/26/2017, 4:04 PM

## 2017-02-26 NOTE — Progress Notes (Signed)
Carol Zhang is a 22 y.o. G1P0 at 5961w0d by ultrasound admitted for induction of labor due to Gestational diabetes.  Subjective: Patient today stating she wants an epidural. Stating "I feel like I'm going to die, I think I am ready for that epidural now". Nursing staff made aware. No other concerns at the moment.   Objective: BP 118/65   Pulse 79   Temp 98.1 F (36.7 C) (Oral)   Resp 18   Ht 5\' 3"  (1.6 m)   Wt 133.4 kg (294 lb 0.6 oz)   LMP 05/18/2016 (Approximate)   BMI 52.09 kg/m  No intake/output data recorded. No intake/output data recorded.  FHT:  FHR: 130 bpm, variability: moderate,  accelerations:  Present,  decelerations:  Present early UC:   irregular, every 1-3 minutes SVE:   Dilation: 4 Effacement (%): 70 Station: Ballotable Exam by:: J.Thornton, RN   Labs: Lab Results  Component Value Date   WBC 11.6 (H) 02/26/2017   HGB 10.3 (L) 02/26/2017   HCT 31.4 (L) 02/26/2017   MCV 81.1 02/26/2017   PLT 304 02/26/2017    Assessment / Plan: Induction of labor due to gestational diabetes,  progressing well on pitocin  Labor: Progressing normally Fetal Wellbeing:  Category I Pain Control:  Epidural I/D:  n/a Anticipated MOD:  NSVD  Carol Zhang 02/26/2017, 7:23 PM

## 2017-02-26 NOTE — H&P (Signed)
LABOR AND DELIVERY ADMISSION HISTORY AND PHYSICAL NOTE  Carol Zhang is a 22 y.o. female G1P0 with IUP at 24w0dby ultrasound presenting for IOL due to A2GDM.  She reports positive fetal movement. She denies leakage of fluid or vaginal bleeding.  P[facility] History/Complications[facility] Pregnancy complications:  - seizure like activity  - asthma - low vit D  - maternal varicella non immune - maternal obesity  - A2GDM - on glyburide 2.547mqhs  - impaired GTT - hemorrhoids  - tobacco use early in pregnancy   Past Medical History: Past Medical History:  Diagnosis Date  . Asthma   . Gestational diabetes    glyburide    Past Surgical History: Past Surgical History:  Procedure Laterality Date  . WISDOM TOOTH EXTRACTION      Obstetrical History: OB History    Gravida Para Term Preterm AB Living   1             SAB TAB Ectopic Multiple Live Births                  Social History: Used cigarettes early in pregnancy  Social History   Socioeconomic History  . Marital status: Single    Spouse name: Not on file  . Number of children: Not on file  . Years of education: Not on file  . Highest education level: Not on file  Social Needs  . Financial resource strain: Not on file  . Food insecurity - worry: Not on file  . Food insecurity - inability: Not on file  . Transportation needs - medical: Not on file  . Transportation needs - non-medical: Not on file  Occupational History  . Not on file  Tobacco Use  . Smoking status: Former Smoker    Types: Cigarettes    Last attempt to quit: 11/22/2016    Years since quitting: 0.2  . Smokeless tobacco: Never Used  Substance and Sexual Activity  . Alcohol use: No    Alcohol/week: 0.0 oz  . Drug use: No  . Sexual activity: Yes    Partners: Male    Birth control/protection: None  Other Topics Concern  . 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 She lives with her dad and she has six siblings, 4 brothers & 2 sisters. She enjoys driving, watching Netflix and hanging with friends.     Family History: Family History  Problem Relation Age of Onset  . Hypertension Mother   . Hypertension Father   . Colon cancer Maternal Grandmother   . Heart failure Maternal Grandmother   . Hypertension Maternal Grandfather   . Diabetes Maternal Grandfather   . Lung cancer Paternal Grandmother   . Hypertension Other     Allergies: No Known Allergies  Medications Prior to Admission  Medication Sig Dispense Refill Last Dose  . glyBURIDE (DIABETA) 2.5 MG tablet Take 1 tablet (2.5 mg total) at bedtime by mouth. 30 tablet 2 02/25/2017 at Unknown time  . hydrocortisone-pramoxine (PROCTOFOAM HC) rectal foam Place 1 applicator rectally 2 (two) times daily. 10 g 0 Past Month at Unknown time  . Prenat-FeAsp-Meth-FA-DHA w/o A (PRENATE PIXIE) 10-0.6-0.4-200 MG CAPS Take 1 tablet by mouth daily. 30 capsule 12 02/25/2017 at Unknown time  . Vitamin D, Ergocalciferol, (DRISDOL) 50000 units CAPS capsule Take 1 capsule (50,000 Units total) by mouth every 7 (seven)  days. 30 capsule 2 Past Week at Unknown time  . albuterol (PROVENTIL HFA;VENTOLIN HFA) 108 (90 BASE) MCG/ACT inhaler Inhale 2 puffs into the lungs every 6 (six) hours as needed for wheezing or shortness of breath.   Rescue  . Blood Glucose Monitoring Suppl (ACCU-CHEK GUIDE) w/Device KIT 1 each by Does not apply route as directed. 1 kit 0 Taking  . Doxylamine-Pyridoxine (DICLEGIS) 10-10 MG TBEC Take 1 tablet with breakfast and lunch.  Take 2 tablets at bedtime. (Patient not taking: Reported on 02/10/2017) 100 tablet 4 Not Taking at Unknown time  . Elastic Bandages & Supports (COMFORT FIT MATERNITY SUPP LG) MISC 1 Units daily by Does not apply route. 1 each 0 Taking  . glucose blood (ACCU-CHEK GUIDE) test strip Use as instructed 100 each 12 Taking  . Lancets (ACCU-CHEK  MULTICLIX) lancets Use as instructed 100 each 12 Taking     Review of Systems  All systems reviewed and negative except as stated in HPI  Physical Exam Blood pressure (!) 152/74, pulse 89, temperature 98.2 F (36.8 C), temperature source Oral, resp. rate 18, height 5' 3"  (1.6 m), weight 133.4 kg (294 lb 0.6 oz), last menstrual period 05/18/2016. General appearance: alert, oriented, NAD Lungs: normal respiratory effort Heart: regular rate Abdomen: soft, non-tender; gravid, FH appropriate for GA (135) Extremities: No calf swelling or tenderness Presentation: cephalic Fetal monitoring: moderate variability, FHR 135, minimal accels  Uterine activity: contractions every 7 min Dilation: Closed Effacement (%): Thick Station: Ballotable Exam by:: J.Thornton, RN  Prenatal labs: ABO, Rh: O/Positive/-- (06/25 1636) Antibody: Negative (06/25 1636) Rubella: 1.52 (06/25 1636) RPR: Non Reactive (10/15 1010)  HBsAg: Negative (06/25 1636)  HIV: Non Reactive (10/15 1010)  GC/Chlamydia: negative  GBS: Negative (12/18 1013)  2-hr GTT: 190 Genetic screening:  Quad neg  Anatomy US: normal   Prenatal Transfer Tool  Maternal Diabetes: Yes:  Diabetes Type:  Insulin/Medication controlled Genetic Screening: Normal Maternal Ultrasounds/Referrals: Normal Fetal Ultrasounds or other Referrals:  None Maternal Substance Abuse:  Yes:  Type: Smoker Significant Maternal Medications:  Meds include: Other: glyburide, vit D Significant Maternal Lab Results: Lab values include: Group B Strep negative  No results found for this or any previous visit (from the past 24 hour(s)).  Patient Active Problem List   Diagnosis Date Noted  . Hemorrhoids during pregnancy 02/07/2017  . Impaired glucose tolerance test 12/29/2016  . Gestational diabetes 12/21/2016  . Maternal obesity affecting pregnancy, antepartum 10/10/2016  . Low vitamin D level 08/17/2016  . Maternal varicella, non-immune 08/17/2016  .  Supervision of high risk pregnancy, antepartum, third trimester 08/15/2016  . Asthma 08/15/2016  . Seizure-like activity ([place]) 06/04/2015  . Acanthosis nigricans 06/04/2015  . Morbid obesity ([place]) 06/04/2015  . Prediabetes 06/04/2015  . Episodic tension-type headache, not intractable 06/04/2015    Assessment: KAMIRA MELLETTE is a 22 y.o. G1P0 at 45w0dhere for IOL due to ANashotah   #Labor: IOL with cytotec, can use pitocin prn #Pain: Fentanyl, oxy  #FWB: Category 1  #ID:  GBS neg #MOF: breast  #MOC: IUD  SCaroline More1/07/2017, 10:04 AM  I assessed this pt and agree with this assessment

## 2017-02-26 NOTE — Anesthesia Procedure Notes (Signed)
Epidural Patient location during procedure: OB  Staffing Anesthesiologist: Phillips Groutarignan, Hampton Cost, MD Performed: anesthesiologist   Preanesthetic Checklist Completed: patient identified, site marked, surgical consent, pre-op evaluation, timeout performed, IV checked, risks and benefits discussed and monitors and equipment checked  Epidural Patient position: sitting Prep: DuraPrep Patient monitoring: heart rate, continuous pulse ox and blood pressure Approach: right paramedian Location: L3-L4 Injection technique: LOR saline  Needle:  Needle type: Tuohy  Needle gauge: 17 G Needle length: 9 cm and 9 Needle insertion depth: 11 cm Catheter type: closed end flexible Catheter size: 20 Guage Catheter at skin depth: 14 cm Test dose: negative  Assessment Events: blood not aspirated, injection not painful, no injection resistance, negative IV test and no paresthesia  Additional Notes Patient identified. Risks/Benefits/Options discussed with patient including but not limited to bleeding, infection, nerve damage, paralysis, failed block, incomplete pain control, headache, blood pressure changes, nausea, vomiting, reactions to medication both or allergic, itching and postpartum back pain. Confirmed with bedside nurse the patient's most recent platelet count. Confirmed with patient that they are not currently taking any anticoagulation, have any bleeding history or any family history of bleeding disorders. Patient expressed understanding and wished to proceed. All questions were answered. Sterile technique was used throughout the entire procedure. Please see nursing notes for vital signs. Test dose was given through epidural needle and negative prior to continuing to dose epidural or start infusion. Warning signs of high block given to the patient including shortness of breath, tingling/numbness in hands, complete motor block, or any concerning symptoms with instructions to call for help. Patient was given  instructions on fall risk and not to get out of bed. All questions and concerns addressed with instructions to call with any issues.

## 2017-02-26 NOTE — Progress Notes (Addendum)
Carol MixerCarrie L Trachtenberg is a 22 y.o. G1P0 at 5240w0d by ultrasound admitted for induction of labor due to Gestational diabetes.  Subjective: Patient comfortable with epidural.  She is amenable to attempting to AROM at this time.  No current issues.   Objective: BP (!) 91/42   Pulse 77   Temp 98.1 F (36.7 C) (Oral)   Resp 18   Ht 5\' 3"  (1.6 m)   Wt 133.4 kg (294 lb 0.6 oz)   LMP 05/18/2016 (Approximate)   SpO2 100%   BMI 52.09 kg/m  No intake/output data recorded. No intake/output data recorded.  FHT:  FHR: 130 bpm, variability: moderate,  accelerations:  Present,  decelerations:  Present early UC:   irregular, every 1-3 minutes SVE:   Dilation: 4 Effacement (%): 70 Station: -3 Exam by:: McGraw-HillMurayyah Johnson RN  Labs: Lab Results  Component Value Date   WBC 11.6 (H) 02/26/2017   HGB 10.3 (L) 02/26/2017   HCT 31.4 (L) 02/26/2017   MCV 81.1 02/26/2017   PLT 304 02/26/2017    Assessment / Plan: Induction of labor due to gestational diabetes,  progressing well on pitocin  Attempted AROM at 22:15 but unable. Will try again at next check.   Labor: Progressing normally Fetal Wellbeing:  Category I Pain Control:  Epidural I/D:  n/a Anticipated MOD:  NSVD  Freddrick MarchYashika Danea Manter, MD  02/26/2017, 10:30 PM

## 2017-02-27 ENCOUNTER — Encounter (HOSPITAL_COMMUNITY): Payer: Self-pay

## 2017-02-27 DIAGNOSIS — O24429 Gestational diabetes mellitus in childbirth, unspecified control: Secondary | ICD-10-CM

## 2017-02-27 DIAGNOSIS — Z3A39 39 weeks gestation of pregnancy: Secondary | ICD-10-CM

## 2017-02-27 LAB — GLUCOSE, CAPILLARY
GLUCOSE-CAPILLARY: 72 mg/dL (ref 65–99)
Glucose-Capillary: 76 mg/dL (ref 65–99)
Glucose-Capillary: 87 mg/dL (ref 65–99)
Glucose-Capillary: 97 mg/dL (ref 65–99)
Glucose-Capillary: 84 mg/dL (ref 65–99)

## 2017-02-27 LAB — RPR: RPR Ser Ql: NONREACTIVE

## 2017-02-27 MED ORDER — ONDANSETRON HCL 4 MG PO TABS: 4.0000 mg | ORAL_TABLET | ORAL | Status: DC | PRN

## 2017-02-27 MED ORDER — BENZOCAINE-MENTHOL 20-0.5 % EX AERO
1.0000 "application " | INHALATION_SPRAY | CUTANEOUS | Status: DC | PRN
  Administered 2017-02-27 – 2017-02-28 (×2): 1 via TOPICAL
  Filled 2017-02-27 (×2): qty 56

## 2017-02-27 MED ORDER — PRENATAL MULTIVITAMIN CH
1.0000 | ORAL_TABLET | Freq: Every day | ORAL | Status: DC
  Administered 2017-02-28 – 2017-03-01 (×2): 1 via ORAL
  Filled 2017-02-27 (×2): qty 1

## 2017-02-27 MED ORDER — DIPHENHYDRAMINE HCL 25 MG PO CAPS: 25.0000 mg | ORAL_CAPSULE | Freq: Four times a day (QID) | ORAL | Status: DC | PRN

## 2017-02-27 MED ORDER — DIBUCAINE 1 % RE OINT
1.0000 "application " | TOPICAL_OINTMENT | RECTAL | Status: DC | PRN
  Administered 2017-02-27: 1 via RECTAL
  Filled 2017-02-27: qty 28

## 2017-02-27 MED ORDER — SIMETHICONE 80 MG PO CHEW: 80.0000 mg | CHEWABLE_TABLET | ORAL | Status: DC | PRN

## 2017-02-27 MED ORDER — IBUPROFEN 600 MG PO TABS
600.0000 mg | ORAL_TABLET | Freq: Four times a day (QID) | ORAL | Status: DC
  Administered 2017-02-27 – 2017-03-01 (×7): 600 mg via ORAL
  Filled 2017-02-27 (×7): qty 1

## 2017-02-27 MED ORDER — ZOLPIDEM TARTRATE 5 MG PO TABS: 5.0000 mg | ORAL_TABLET | Freq: Every evening | ORAL | Status: DC | PRN

## 2017-02-27 MED ORDER — OXYCODONE-ACETAMINOPHEN 5-325 MG PO TABS
1.0000 | ORAL_TABLET | ORAL | Status: DC | PRN
  Administered 2017-02-28: 1 via ORAL
  Filled 2017-02-27: qty 1

## 2017-02-27 MED ORDER — WITCH HAZEL-GLYCERIN EX PADS
1.0000 "application " | MEDICATED_PAD | CUTANEOUS | Status: DC | PRN
  Administered 2017-02-27: 1 via TOPICAL

## 2017-02-27 MED ORDER — OXYCODONE-ACETAMINOPHEN 5-325 MG PO TABS
2.0000 | ORAL_TABLET | ORAL | Status: DC | PRN
  Administered 2017-02-27: 2 via ORAL
  Filled 2017-02-27: qty 2

## 2017-02-27 MED ORDER — ACETAMINOPHEN 325 MG PO TABS: 650.0000 mg | ORAL_TABLET | ORAL | Status: DC | PRN

## 2017-02-27 MED ORDER — COCONUT OIL OIL: 1.0000 "application " | TOPICAL_OIL | Status: DC | PRN

## 2017-02-27 MED ORDER — ONDANSETRON HCL 4 MG/2ML IJ SOLN: 4.0000 mg | INTRAMUSCULAR | Status: DC | PRN

## 2017-02-27 MED ORDER — TETANUS-DIPHTH-ACELL PERTUSSIS 5-2.5-18.5 LF-MCG/0.5 IM SUSP: 0.5000 mL | Freq: Once | INTRAMUSCULAR | Status: DC

## 2017-02-27 MED ORDER — SENNOSIDES-DOCUSATE SODIUM 8.6-50 MG PO TABS
2.0000 | ORAL_TABLET | ORAL | Status: DC
  Administered 2017-02-27 – 2017-02-28 (×2): 2 via ORAL
  Filled 2017-02-27 (×2): qty 2

## 2017-02-27 NOTE — Progress Notes (Signed)
Zhang&D Note  02/27/2017 - 9:37 AM  21 y.o. G1P0 4692w1d. Pregnancy complicated by BMI 52, GDMa2 (glyburide), ?h/o sz d/o  Ms. Carol Zhang is admitted for IOL for GDMa2   Subjective:  Comfortable s/p epidural placement  Objective:    Current Vital Signs 24h Vital Sign Ranges  T 98.1 F (36.7 C) Temp  Avg: 98.3 F (36.8 C)  Min: 97.7 F (36.5 C)  Max: 98.8 F (37.1 C)  BP 118/71 BP  Min: 91/42  Max: 150/74  HR 82 Pulse  Avg: 83.6  Min: 68  Max: 124  RR 20 Resp  Avg: 17.9  Min: 16  Max: 20  SaO2 100 %   SpO2  Avg: 99.8 %  Min: 99 %  Max: 100 %       24 Hour I/O Current Shift I/O  Time Ins Outs 01/06 0701 - 01/07 0700 In: -  Out: 1425 [Urine:1425] No intake/output data recorded.   FHR: 145 baseline, no accels, recurrent lates, mod variabilty Toco: q1-6966m Gen: NAD SVE: 7/70/-1, no molding, IUPC placed  Labs:  Recent Labs  Lab 02/26/17 1003  WBC 11.6*  HGB 10.3*  HCT 31.4*  PLT 304   Results for Carol Zhang, Carol Zhang (MRN [id number]) as of 02/27/2017 09:39  Ref. Range 02/26/2017 13:23 02/26/2017 17:04 02/26/2017 21:04 02/27/2017 01:06 02/27/2017 05:06  Glucose-Capillary Latest Ref Range: 65 - 99 mg/dL 93 79 90 76 87    Medications Current Facility-Administered Medications  Medication Dose Route Frequency Provider Last Rate Last Dose  . acetaminophen (TYLENOL) tablet 650 mg  650 mg Oral Q4H PRN Arabella MerlesShaw, Kimberly D, CNM   650 mg at 02/27/17 [phone]  . diphenhydrAMINE (BENADRYL) injection 12.5 mg  12.5 mg Intravenous Q15 min PRN Phillips Groutarignan, Peter, MD      . ePHEDrine injection 10 mg  10 mg Intravenous PRN Phillips Groutarignan, Peter, MD      . ePHEDrine injection 10 mg  10 mg Intravenous PRN Phillips Groutarignan, Peter, MD      . fentaNYL (SUBLIMAZE) injection 100 mcg  100 mcg Intravenous Q1H PRN Cam HaiShaw, Kimberly D, CNM   100 mcg at 02/26/17 1805  . fentaNYL 2.5 mcg/ml w/bupivacaine 0.1% in NS 100ml epidural infusion (WH-ANES)  14 mL/hr Epidural Continuous PRN Phillips Groutarignan, Peter, MD 14 mL/hr at 02/27/17 0404 14 mL/hr at  02/27/17 0404  . lactated ringers infusion 500-1,000 mL  500-1,000 mL Intravenous PRN Arabella MerlesShaw, Kimberly D, CNM      . lactated ringers infusion   Intravenous Continuous Cam HaiShaw, Kimberly D, CNM      . lidocaine (PF) (XYLOCAINE) 1 % injection 30 mL  30 mL Subcutaneous PRN Arabella MerlesShaw, Kimberly D, CNM      . misoprostol (CYTOTEC) tablet 25 mcg  25 mcg Vaginal Q4H PRN Arabella MerlesShaw, Kimberly D, CNM   25 mcg at 02/26/17 [id number]  . ondansetron (ZOFRAN) injection 4 mg  4 mg Intravenous Q6H PRN Arabella MerlesShaw, Kimberly D, CNM      . oxyCODONE-acetaminophen (PERCOCET/ROXICET) 5-325 MG per tablet 1 tablet  1 tablet Oral Q4H PRN Arabella MerlesShaw, Kimberly D, CNM      . oxyCODONE-acetaminophen (PERCOCET/ROXICET) 5-325 MG per tablet 2 tablet  2 tablet Oral Q4H PRN Arabella MerlesShaw, Kimberly D, CNM      . oxytocin (PITOCIN) IV infusion 40 units in LR 1000 mL - Premix  2.5 Units/hr Intravenous Continuous Arabella MerlesShaw, Kimberly D, CNM      . oxytocin (PITOCIN) IV infusion 40 units in LR 1000 mL - Premix  1-40 milli-units/min Intravenous Titrated  Montez Morita, CNM   Stopped at 02/27/17 [phone]  . PHENYLephrine 40 mcg/ml in normal saline Adult IV Push Syringe  80 mcg Intravenous PRN Phillips Grout, MD      . PHENYLephrine 40 mcg/ml in normal saline Adult IV Push Syringe  80 mcg Intravenous PRN Phillips Grout, MD      . sodium citrate-citric acid (ORACIT) solution 30 mL  30 mL Oral Q2H PRN Arabella Merles, CNM      . terbutaline (BRETHINE) injection 0.25 mg  0.25 mg Subcutaneous Once PRN Arabella Merles, CNM       Facility-Administered Medications Ordered in Other Encounters  Medication Dose Route Frequency Provider Last Rate Last Dose  . lidocaine (PF) (XYLOCAINE) 1 % injection    Anesthesia Intra-op Phillips Grout, MD   5 mL at 02/26/17 1944    Assessment & Plan:  Pt stable *IUP: category II currently *IOL: will d/c pitocin (was at 16) and IUPC placed. Will start AI prn. Will give pit break for at least an hour and reevaluate in 18m and consider restarting if fetal  status more reassuring. Pelvis feels adequate *GDMA2: continue with q1-2h BS checks -12/26: efw >90%, 3742gm, AC >97%, normal AF *GBS: neg *Analgesia: epidural in place.   Cornelia Copa MD Attending Center for [facility])

## 2017-02-27 NOTE — Progress Notes (Addendum)
Labor Progress Note Carol Zhang is a 22 y.o. G1P0 at 6763w1d presented for IOL for GDMa2 (on glyburide), questionable h/o seizure d/o  S: Patient reporting some lower R sided discomfort while lying on side.   O:  BP 134/71   Pulse 89   Temp 97.8 F (36.6 C) (Oral)   Resp 20   Ht 5\' 3"  (1.6 m)   Wt 133.4 kg (294 lb 0.6 oz)   LMP 05/18/2016 (Approximate)   SpO2 100%   BMI 52.09 kg/m  EFM: HR 155/moderate variability/- accels, occasional late decels Toco: contractions Q2-733min  CVE: Dilation: 8 Effacement (%): 90 Cervical Position: Anterior Station: 0 Presentation: Vertex Exam by:: Dr. Rushie GoltzLeland   A&P: 22 y.o. G1P0 6063w1d IOL for GDMa2 (on glyburide), questionable h/o seizure d/o #Labor: Progress slowing. FSE placed by myself. Concern given variable decels and slowing progress. Cervical recheck at 1900 and if no progress will likely go to c-section. #Pain: controlled by epidural, tolerable per patient. Appears to have adequate contractions.  #FWB: Cat II NST #GBS negative # GDMA2: Q1-2H BG checks, well controlled    Carol BailiffParker W Solimar Maiden, MD 5:00 PM

## 2017-02-27 NOTE — Progress Notes (Signed)
Labor Progress Note Carol MixerCarrie L Zhang is a 22 y.o. G1P0 at 1923w1d presented for IOL for GDMa2 (on glyburide), questionable h/o seizure d/o   S: Patient reports she is doing well, comfortable on side with epidural placement  O:  BP (!) 100/48   Pulse 78   Temp 98.5 F (36.9 C) (Oral)   Resp 20   Ht 5\' 3"  (1.6 m)   Wt 133.4 kg (294 lb 0.6 oz)   LMP 05/18/2016 (Approximate)   SpO2 100%   BMI 52.09 kg/m  EFM: 140/moderate variability /+ accels. No decels. Reactive NST.   CVE: Dilation: 8 Effacement (%): 70 Cervical Position: Anterior Station: -1 Presentation: Vertex Exam by:: Katherine g jones RN    A&P: 22 y.o. G1P0 6323w1d here for IOL for GDMa2 #Labor: Progressing well. S/p pitocin break at 16, IUPC placed. AI PRN. Pelvis appears adequate. On pitocin gtt, s/p epidural, cervical check after 1245. #Pain: controlled #FWB: good, reactive NST as above #GBS negative # GDMA2: Q1-2H BG checks  Alroy BailiffParker W Sedonia Kitner, MD, PGY-1 12:15 PM

## 2017-02-27 NOTE — Progress Notes (Signed)
Carol Zhang is a 22 y.o. G1P0 at 4265w0d by ultrasound admitted for induction of labor due to Gestational diabetes.  Subjective: Patient states she is comfortable, no issues at current time. Reports good fetal movement.    Objective: BP 120/66   Pulse 79   Temp 98.7 F (37.1 C)   Resp 18   Ht 5\' 3"  (1.6 m)   Wt 133.4 kg (294 lb 0.6 oz)   LMP 05/18/2016 (Approximate)   SpO2 100%   BMI 52.09 kg/m  No intake/output data recorded. No intake/output data recorded.  FHT:  FHR: 130 bpm, variability: moderate,  accelerations:  Present,  decelerations:  Present early UC:   irregular, every 1-3 minutes SVE:   Dilation: (P) 4.5 Effacement (%): (P) 70 Station: (P) -3 Exam by:: (P) Dr. Doroteo GlassmanPhelps  Labs: Lab Results  Component Value Date   WBC 11.6 (H) 02/26/2017   HGB 10.3 (L) 02/26/2017   HCT 31.4 (L) 02/26/2017   MCV 81.1 02/26/2017   PLT 304 02/26/2017    Assessment / Plan: Induction of labor due to gestational diabetes,  progressing well on pitocin  AROM performed at 0300.   Labor: Progressing normally Fetal Wellbeing:  Category I Pain Control:  Epidural I/D:  n/a Anticipated MOD:  NSVD   Carol MarchYashika Bensyn Bornemann, MD  02/27/2017, 3:09 AM

## 2017-02-27 NOTE — Consult Note (Signed)
Note   02/27/2017  8:33 PM  Code Apgar paged to Room 172 for poor tone in a newborn infant.  Delivery team arrived and found infant crying vigorously under the radiant warmer.  Delivery team excused by L&D staff upon arrival.   Chales AbrahamsMary Ann V.T. Dimaguila, MD Neonatologist

## 2017-02-27 NOTE — Addendum Note (Signed)
Addended by: Maretta BeesMCGLASHAN, CAROL J on: 02/27/2017 11:50 AM   Modules accepted: Orders

## 2017-02-27 NOTE — Progress Notes (Signed)
Carol Zhang is a 22 y.o. G1P0 at 321w0d by ultrasound admitted for induction of labor due to Gestational diabetes.  Subjective: Patient states she is comfortable at this time.  Reports good fetal movement.    Objective: BP (!) 108/57 (BP Location: Right Arm)   Pulse 80   Temp 98.8 F (37.1 C) (Axillary)   Resp 20   Ht 5\' 3"  (1.6 m)   Wt 133.4 kg (294 lb 0.6 oz)   LMP 05/18/2016 (Approximate)   SpO2 100%   BMI 52.09 kg/m  I/O last 3 completed shifts: In: -  Out: 1425 [Urine:1425] No intake/output data recorded.  FHT:  FHR: 130 bpm, variability: moderate,  accelerations:  Present,  decelerations:  Present early UC:   irregular, every 3 minutes SVE:   Dilation: 5.5 Effacement (%): 80 Station: 0 Exam by:: katherine g jones RN   Labs: Lab Results  Component Value Date   WBC 11.6 (H) 02/26/2017   HGB 10.3 (L) 02/26/2017   HCT 31.4 (L) 02/26/2017   MCV 81.1 02/26/2017   PLT 304 02/26/2017    Assessment / Plan: Slow progression on Pitocin.  Plan to place IUPC.   Labor: Progressing on Pitocin.  Fetal Wellbeing:  Category I Pain Control:  Epidural I/D:  n/a Anticipated MOD:  NSVD   Carol MarchYashika Chukwudi Ewen, MD  02/27/2017, 9:11 AM

## 2017-02-28 ENCOUNTER — Other Ambulatory Visit: Payer: Self-pay

## 2017-02-28 ENCOUNTER — Other Ambulatory Visit: Payer: Medicaid Other

## 2017-02-28 ENCOUNTER — Encounter: Payer: Medicaid Other | Admitting: Obstetrics and Gynecology

## 2017-02-28 ENCOUNTER — Encounter (HOSPITAL_COMMUNITY): Payer: Self-pay

## 2017-02-28 LAB — GLUCOSE, CAPILLARY: Glucose-Capillary: 89 mg/dL (ref 65–99)

## 2017-02-28 MED ORDER — PNEUMOCOCCAL VAC POLYVALENT 25 MCG/0.5ML IJ INJ
0.5000 mL | INJECTION | INTRAMUSCULAR | Status: DC
  Filled 2017-02-28: qty 0.5

## 2017-02-28 NOTE — Anesthesia Postprocedure Evaluation (Signed)
Anesthesia Post Note  Patient: Carol Zhang  Procedure(s) Performed: AN AD HOC LABOR EPIDURAL     Patient location during evaluation: Mother Baby Anesthesia Type: Epidural Level of consciousness: awake, awake and alert and oriented Pain management: pain level controlled Vital Signs Assessment: post-procedure vital signs reviewed and stable Respiratory status: spontaneous breathing and respiratory function stable Cardiovascular status: blood pressure returned to baseline Postop Assessment: no headache, no backache, epidural receding, patient able to bend at knees, no apparent nausea or vomiting and adequate PO intake Anesthetic complications: no    Last Vitals:  Vitals:   02/27/17 2330 02/28/17 0330  BP: (!) 145/71 134/63  Pulse: 77 84  Resp: 20 18  Temp: 36.9 C 36.8 C  SpO2: 99%     Last Pain:  Vitals:   02/28/17 0552  TempSrc:   PainSc: 2    Pain Goal:                 Cleda ClarksBrowder, Jannatul Wojdyla R

## 2017-02-28 NOTE — Progress Notes (Signed)
Post Partum Day 1 Subjective: no complaints, up ad lib, voiding and tolerating PO  Pain is well controlled  No BMs   Objective: Blood pressure 134/63, pulse 84, temperature 98.3 F (36.8 C), temperature source Oral, resp. rate 18, height 5\' 3"  (1.6 m), weight 133.4 kg (294 lb 0.6 oz), last menstrual period 05/18/2016, SpO2 99 %, unknown if currently breastfeeding.  Physical Exam:  General: alert and no distress Lochia: appropriate Uterine Fundus: firm, below umbilicus  Incision: NA DVT Evaluation: No evidence of DVT seen on physical exam.   Recent Labs    02/26/17 1003  HGB 10.3*  HCT 31.4*   CBG (last 3)  Recent Labs    02/27/17 1328 02/27/17 1716 02/28/17 0722  GLUCAP 84 72 89     Assessment/Plan:  21 yr female G1P1 PPD #1 s/p SVD, IOL for A2GDM. Last CBG 89. Doing well. Breastfeeding and Contraception IUD  Plan for discharge tomorrow    LOS: 2 days   Carol Ihaicole Emrys Mceachron PA-S2 02/28/2017, 7:49 AM

## 2017-02-28 NOTE — Addendum Note (Signed)
Addendum  created 02/28/17 0745 by Cleda ClarksBrowder, Ruthene Methvin R, CRNA   Sign clinical note

## 2017-02-28 NOTE — Anesthesia Postprocedure Evaluation (Signed)
Anesthesia Post Note  Patient: Carol Zhang  Procedure(s) Performed: AN AD HOC LABOR EPIDURAL     Patient location during evaluation: Mother Baby Anesthesia Type: Epidural Level of consciousness: awake and alert Pain management: pain level controlled Vital Signs Assessment: post-procedure vital signs reviewed and stable Respiratory status: spontaneous breathing, nonlabored ventilation and respiratory function stable Cardiovascular status: stable Postop Assessment: no headache, no backache and epidural receding Anesthetic complications: no    Last Vitals:  Vitals:   02/27/17 2330 02/28/17 0330  BP: (!) 145/71 134/63  Pulse: 77 84  Resp: 20 18  Temp: 36.9 C 36.8 C  SpO2: 99%     Last Pain:  Vitals:   02/28/17 0552  TempSrc:   PainSc: 2                  Phillips Groutarignan, Yemariam Magar

## 2017-02-28 NOTE — Lactation Note (Signed)
This note was copied from a baby's chart. Lactation Consultation Note  Patient Name: Carol Zhang: 02/28/2017 Reason for consult: Initial assessment;Primapara;Term Breastfeeding consultation services and support information given to mom.  Baby is 8418 hours old.  Mom reports baby is having difficulty sustaining latch.  Mom is pumping and hand expressing.  Baby recently had formula and is sleeping.  Instructed to call for feeding assist when baby starts to cue, continue pumping and hand expressing every 2-3 hours.  Maternal Data Has patient been taught Hand Expression?: Yes Does the patient have breastfeeding experience prior to this delivery?: No  Feeding Feeding Type: Bottle Fed - Formula Nipple Type: Slow - flow  LATCH Score                   Interventions    Lactation Tools Discussed/Used     Consult Status Consult Status: Follow-up Zhang: 03/01/17 Follow-up type: In-patient    Huston FoleyMOULDEN, Cailin Gebel S 02/28/2017, 2:54 PM

## 2017-03-01 MED ORDER — IBUPROFEN 800 MG PO TABS: 800.0000 mg | ORAL_TABLET | Freq: Three times a day (TID) | ORAL | 0 refills | Status: DC | PRN

## 2017-03-01 NOTE — Discharge Summary (Signed)
OB Discharge Summary     Patient Name: Carol Zhang DOB: 1995/12/27 MRN: [id number]  Date of admission: 02/26/2017 Delivering MD: Luiz Blare Y   Date of discharge: 03/01/2017  Admitting diagnosis: 60 wk induction Intrauterine pregnancy: [redacted]w[redacted]d    Secondary diagnosis:  Principal Problem:   SVD (spontaneous vaginal delivery) Active Problems:   [place]   Morbid obesity ([place]   Gestational diabetes  Additional problems: None     Discharge diagnosis: Term Pregnancy Delivered and GDM A2                                                                                                Post partum procedures:None   Augmentation: AROM, Pitocin, Cytotec and Foley Balloon  Complications: None  Hospital course:  Onset of Labor With Vaginal Delivery     22y.o. yo G1P1001 at 345w1das admitted in Latent Labor on 02/26/2017. Patient had an uncomplicated labor course as follows:  Membrane Rupture Time/Date: 3:01 AM ,02/27/2017   Intrapartum Procedures: Episiotomy: None [1]                                         Lacerations:  Labial [10];Sulcus [9]  Patient had a delivery of a Viable infant. 02/27/2017  Information for the patient's newborn:  PaYarixa, Lightcap0[[id number]]Delivery Method: Vag-Spont    Pateint had an uncomplicated postpartum course.  She is ambulating, tolerating a regular diet, passing flatus, and urinating well. Patient is discharged home in stable condition on 03/01/17.   Physical exam  Vitals:   02/28/17 0330 02/28/17 1113 02/28/17 1803 03/01/17 0546  BP: 134/63 98/74 126/65 116/63  Pulse: 84 77 93 75  Resp: 18 18 18 20   Temp: 98.3 F (36.8 C) 97.6 F (36.4 C) 98.4 F (36.9 C) 98.3 F (36.8 C)  TempSrc: Oral Oral Oral Oral  SpO2:  97%    Weight:      Height:       General: alert, cooperative and no distress Lochia: appropriate Uterine Fundus: firm Incision: N/A DVT Evaluation: No evidence of DVT seen on physical exam. Labs: Lab  Results  Component Value Date   WBC 11.6 (H) 02/26/2017   HGB 10.3 (L) 02/26/2017   HCT 31.4 (L) 02/26/2017   MCV 81.1 02/26/2017   PLT 304 02/26/2017   CMP Latest Ref Rng & Units 02/26/2017  Glucose 65 - 99 mg/dL 101(H)  BUN 6 - 20 mg/dL -  Creatinine 0.44 - 1.00 mg/dL -  Sodium 135 - 145 mmol/L -  Potassium 3.5 - 5.1 mmol/L -  Chloride 101 - 111 mmol/L -  CO2 22 - 32 mmol/L -  Calcium 8.9 - 10.3 mg/dL -  Total Protein 6.5 - 8.1 g/dL -  Total Bilirubin 0.3 - 1.2 mg/dL -  Alkaline Phos 47 - 119 U/L -  AST 15 - 41 U/L -  ALT 14 - 54 U/L -    Discharge instruction: per After Visit Summary and "  Baby and Me Booklet".  After visit meds:  Allergies as of 03/01/2017   No Known Allergies     Medication List    STOP taking these medications   Doxylamine-Pyridoxine 10-10 MG Tbec Commonly known as:  DICLEGIS     TAKE these medications   ACCU-CHEK GUIDE w/Device Kit 1 each by Does not apply route as directed.   accu-chek multiclix lancets Use as instructed   albuterol 108 (90 Base) MCG/ACT inhaler Commonly known as:  PROVENTIL HFA;VENTOLIN HFA Inhale 2 puffs into the lungs every 6 (six) hours as needed for wheezing or shortness of breath.   COMFORT FIT MATERNITY SUPP LG Misc 1 Units daily by Does not apply route.   glucose blood test strip Commonly known as:  ACCU-CHEK GUIDE Use as instructed   glyBURIDE 2.5 MG tablet Commonly known as:  DIABETA Take 1 tablet (2.5 mg total) at bedtime by mouth.   hydrocortisone-pramoxine rectal foam Commonly known as:  PROCTOFOAM HC Place 1 applicator rectally 2 (two) times daily.   ibuprofen 800 MG tablet Commonly known as:  ADVIL,MOTRIN Take 1 tablet (800 mg total) by mouth every 8 (eight) hours as needed.   PRENATE PIXIE 10-0.6-0.4-200 MG Caps Take 1 tablet by mouth daily.   Vitamin D (Ergocalciferol) 50000 units Caps capsule Commonly known as:  DRISDOL Take 1 capsule (50,000 Units total) by mouth every 7 (seven) days.        Diet: routine diet  Activity: Advance as tolerated. Pelvic rest for 6 weeks.   Outpatient follow up:6 weeks Follow up Appt: Future Appointments  Date Time Provider [place]  03/27/2017  8:30 AM Constant, Vickii Chafe, MD [place] None   Follow up Visit:No Follow-up on file.  Postpartum contraception: IUD Mirena  Newborn Data: Live born female  Birth Weight: 7 lb 12.2 oz (3521 g) APGAR: 4, 9  Newborn Delivery   Birth date/time:  02/27/2017 20:19:00 Delivery type:  Vaginal, Spontaneous     Baby Feeding: Breast Disposition:home with mother   03/01/2017 Marcille Buffy, CNM

## 2017-03-03 ENCOUNTER — Other Ambulatory Visit: Payer: Medicaid Other

## 2017-03-07 ENCOUNTER — Encounter: Payer: Self-pay | Admitting: *Deleted

## 2017-03-27 ENCOUNTER — Ambulatory Visit: Payer: Medicaid Other | Admitting: Obstetrics and Gynecology

## 2017-04-04 ENCOUNTER — Ambulatory Visit (INDEPENDENT_AMBULATORY_CARE_PROVIDER_SITE_OTHER): Payer: Medicaid Other | Admitting: Obstetrics and Gynecology

## 2017-04-04 ENCOUNTER — Encounter: Payer: Self-pay | Admitting: Obstetrics and Gynecology

## 2017-04-04 VITALS — BP 154/85 | HR 150 | Wt 281.4 lb

## 2017-04-04 DIAGNOSIS — Z1389 Encounter for screening for other disorder: Secondary | ICD-10-CM

## 2017-04-04 DIAGNOSIS — Z30011 Encounter for initial prescription of contraceptive pills: Secondary | ICD-10-CM

## 2017-04-04 DIAGNOSIS — R7309 Other abnormal glucose: Secondary | ICD-10-CM

## 2017-04-04 MED ORDER — DESOGESTREL-ETHINYL ESTRADIOL 0.15-30 MG-MCG PO TABS: 1.0000 | ORAL_TABLET | Freq: Every day | ORAL | 11 refills | Status: DC

## 2017-04-04 NOTE — Patient Instructions (Signed)

## 2017-04-04 NOTE — Progress Notes (Signed)
Post Partum Exam  Carol MixerCarrie L Zhang is a 22 y.o. 301P1001 female who presents for a postpartum visit. She is 4 weeks postpartum following a spontaneous vaginal delivery. I have fully reviewed the prenatal and intrapartum course. The delivery was at 39 2/7 gestational weeks.  Anesthesia: epidural. Postpartum course has been good. Baby's course has been good. Baby is feeding by bottle - Enfamil Gentlease. Bleeding staining only. Bowel function is normal. Bladder function is normal. Patient is not sexually active.  Pt requesting birth control pills.. Postpartum depression screening: SCORE 0  The following portions of the patient's history were reviewed and updated as appropriate: allergies, current medications, past family history, past medical history, past social history and past surgical history.  Review of Systems Pertinent items are noted in HPI.    Objective:  Last menstrual period 05/18/2016, unknown if currently breastfeeding.  General:  alert   Breasts:  not examined  Lungs: clear to auscultation bilaterally  Heart:  regular rate and rhythm, S1, S2 normal, no murmur, click, rub or gallop  Abdomen: soft, non-tender; bowel sounds normal; no masses,  no organomegaly   Vulva:  not evaluated  Vagina: not evaluated  Cervix:  not evaluated  Corpus: not examined  Adnexa:  not evaluated  Rectal Exam: Not performed.        Assessment:    Nl postpartum exam.   Contraceptive management   Abnormal glucose screen  Plan:   1. Contraception: OCP's. U/R/B and back up method reviewed with pt 2. Return to nl ADL's Glucola scree next week 3. Follow up in: 1 yr or as needed.

## 2017-04-05 ENCOUNTER — Encounter: Payer: Self-pay | Admitting: Obstetrics and Gynecology

## 2017-04-13 ENCOUNTER — Other Ambulatory Visit: Payer: Medicaid Other

## 2017-04-17 ENCOUNTER — Other Ambulatory Visit: Payer: Medicaid Other

## 2017-04-17 DIAGNOSIS — O99815 Abnormal glucose complicating the puerperium: Secondary | ICD-10-CM

## 2017-04-18 LAB — GLUCOSE TOLERANCE, 2 HOURS
Glucose, 2 hour: 101 mg/dL (ref 65–139)
Glucose, GTT - Fasting: 108 mg/dL — ABNORMAL HIGH (ref 65–99)

## 2017-04-20 ENCOUNTER — Telehealth: Payer: Self-pay

## 2017-04-20 NOTE — Telephone Encounter (Signed)
-----   Message from Hermina StaggersMichael L Ervin, MD sent at 04/20/2017  8:44 AM EST ----- Pt needs to see PCP for abnormal glucola test results Thanks Casimiro NeedleMichael

## 2017-04-20 NOTE — Telephone Encounter (Signed)
Patient notified, she verbalized understanding.

## 2017-06-28 ENCOUNTER — Other Ambulatory Visit: Payer: Self-pay

## 2017-06-28 ENCOUNTER — Encounter (HOSPITAL_COMMUNITY): Payer: Self-pay | Admitting: Emergency Medicine

## 2017-06-28 ENCOUNTER — Emergency Department (HOSPITAL_COMMUNITY): Payer: Medicaid Other

## 2017-06-28 ENCOUNTER — Emergency Department (HOSPITAL_COMMUNITY)
Admission: EM | Admit: 2017-06-28 | Discharge: 2017-06-28 | Disposition: A | Discharge status: Home / Self Care | Payer: Medicaid Other | Attending: Emergency Medicine | Admitting: Emergency Medicine

## 2017-06-28 DIAGNOSIS — J40 Bronchitis, not specified as acute or chronic: Secondary | ICD-10-CM

## 2017-06-28 DIAGNOSIS — R109 Unspecified abdominal pain: Secondary | ICD-10-CM | POA: Diagnosis present

## 2017-06-28 DIAGNOSIS — Z87891 Personal history of nicotine dependence: Secondary | ICD-10-CM | POA: Insufficient documentation

## 2017-06-28 DIAGNOSIS — J4531 Mild persistent asthma with (acute) exacerbation: Secondary | ICD-10-CM | POA: Diagnosis not present

## 2017-06-28 LAB — CBC
HEMATOCRIT: 40.4 % (ref 36.0–46.0)
Hemoglobin: 13.2 g/dL (ref 12.0–15.0)
MCH: 26 pg (ref 26.0–34.0)
MCHC: 32.7 g/dL (ref 30.0–36.0)
MCV: 79.5 fL (ref 78.0–100.0)
Platelets: 310 10*3/uL (ref 150–400)
RBC: 5.08 MIL/uL (ref 3.87–5.11)
RDW: 14.3 % (ref 11.5–15.5)
WBC: 9.3 10*3/uL (ref 4.0–10.5)

## 2017-06-28 LAB — COMPREHENSIVE METABOLIC PANEL
ALT: 46 U/L (ref 14–54)
AST: 38 U/L (ref 15–41)
Albumin: 4.1 g/dL (ref 3.5–5.0)
Alkaline Phosphatase: 83 U/L (ref 38–126)
Anion gap: 10 (ref 5–15)
BUN: 6 mg/dL (ref 6–20)
CO2: 24 mmol/L (ref 22–32)
Calcium: 9.1 mg/dL (ref 8.9–10.3)
Chloride: 104 mmol/L (ref 101–111)
Creatinine, Ser: 0.96 mg/dL (ref 0.44–1.00)
Glucose, Bld: 130 mg/dL — ABNORMAL HIGH (ref 65–99)
POTASSIUM: 3.9 mmol/L (ref 3.5–5.1)
SODIUM: 138 mmol/L (ref 135–145)
Total Bilirubin: 0.5 mg/dL (ref 0.3–1.2)
Total Protein: 7.4 g/dL (ref 6.5–8.1)

## 2017-06-28 LAB — URINALYSIS, ROUTINE W REFLEX MICROSCOPIC
Glucose, UA: NEGATIVE mg/dL
HGB URINE DIPSTICK: NEGATIVE
Ketones, ur: NEGATIVE mg/dL
LEUKOCYTES UA: NEGATIVE
Nitrite: NEGATIVE
PROTEIN: 30 mg/dL — AB
Specific Gravity, Urine: 1.03 (ref 1.005–1.030)
pH: 5 (ref 5.0–8.0)

## 2017-06-28 LAB — LIPASE, BLOOD: Lipase: 20 U/L (ref 11–51)

## 2017-06-28 LAB — I-STAT BETA HCG BLOOD, ED (MC, WL, AP ONLY)

## 2017-06-28 MED ORDER — IPRATROPIUM BROMIDE 0.02 % IN SOLN
0.5000 mg | Freq: Once | RESPIRATORY_TRACT | Status: AC
  Administered 2017-06-28: 0.5 mg via RESPIRATORY_TRACT
  Filled 2017-06-28: qty 2.5

## 2017-06-28 MED ORDER — ALBUTEROL SULFATE (2.5 MG/3ML) 0.083% IN NEBU
5.0000 mg | INHALATION_SOLUTION | Freq: Once | RESPIRATORY_TRACT | Status: AC
Start: 2017-06-28 — End: 2017-06-28
  Administered 2017-06-28: 5 mg via RESPIRATORY_TRACT
  Filled 2017-06-28: qty 6

## 2017-06-28 MED ORDER — BENZONATATE 100 MG PO CAPS: 100.0000 mg | ORAL_CAPSULE | Freq: Three times a day (TID) | ORAL | 0 refills | Status: DC

## 2017-06-28 MED ORDER — PREDNISONE 20 MG PO TABS
60.0000 mg | ORAL_TABLET | Freq: Once | ORAL | Status: AC
  Administered 2017-06-28: 60 mg via ORAL
  Filled 2017-06-28: qty 3

## 2017-06-28 MED ORDER — PREDNISONE 20 MG PO TABS: ORAL_TABLET | ORAL | 0 refills | Status: DC

## 2017-06-28 MED ORDER — ALBUTEROL SULFATE HFA 108 (90 BASE) MCG/ACT IN AERS
2.0000 | INHALATION_SPRAY | RESPIRATORY_TRACT | Status: DC | PRN
  Administered 2017-06-28: 2 via RESPIRATORY_TRACT
  Filled 2017-06-28: qty 6.7

## 2017-06-28 NOTE — ED Notes (Signed)
Pt ambulatory to restroom with steady gait. Stand by assist for safety

## 2017-06-28 NOTE — ED Triage Notes (Signed)
Pt reports abd pain, nausea and vomiting since yesterday, describes sharp pain. Fevers at home, tylenol effective.

## 2017-06-28 NOTE — ED Provider Notes (Signed)
[facility] EMERGENCY DEPARTMENT Provider Note   CSN: [id number] Arrival date & time: 06/28/17  0050     History   Chief Complaint Chief Complaint  Patient presents with  . Abdominal Pain    HPI Carol Zhang is a 22 y.o. female.  HPI   22 year old morbidly obese female with history of gestational diabetes presenting for evaluation of abdominal pain.  For the past 3 days patient has cold symptoms including fever, chills, stuffy nose, productive cough, shortness of breath, wheezing.  Last night she also developed abdominal pain.  Described pain as an intermittent pain radiates across her abdomen lasting for several minutes, intense, and then resolved, she has 2 similar episodes of abdominal pain since.  She endorsed recent sick contact.  At home she has tried Tylenol, Delsym, DayQuil without adequate relief.  She endorsed decreased appetite, having nausea vomiting diarrhea, states the cough is productive with dark green sputum, and she is using her inhaler 2-3 pumps every 3-4 hours which is more than her usual.  No complaints of urinary symptoms, vaginal bleeding, vaginal discharge or rash.      Past Medical History:  Diagnosis Date  . Asthma   . Gestational diabetes    glyburide    Patient Active Problem List   Diagnosis Date Noted  . Postpartum care and examination 04/04/2017  . Encounter for initial prescription of contraceptive pills 04/04/2017  . Hemorrhoids during pregnancy 02/07/2017  . Impaired glucose tolerance test 12/29/2016  . Low vitamin D level 08/17/2016  . Maternal varicella, non-immune 08/17/2016  . Asthma 08/15/2016  . Seizure-like activity (HCC) 06/04/2015  . Acanthosis nigricans 06/04/2015  . Morbid obesity (HCC) 06/04/2015  . Prediabetes 06/04/2015  . Episodic tension-type headache, not intractable 06/04/2015    Past Surgical History:  Procedure Laterality Date  . WISDOM TOOTH EXTRACTION       OB History    Gravida  1   Para  1   Term  1   Preterm      AB      Living  1     SAB      TAB      Ectopic      Multiple  0   Live Births  1            Home Medications    Prior to Admission medications   Medication Sig Start Date End Date Taking? Authorizing Provider  albuterol (PROVENTIL HFA;VENTOLIN HFA) 108 (90 BASE) MCG/ACT inhaler Inhale 2 puffs into the lungs every 6 (six) hours as needed for wheezing or shortness of breath.    [provider]  desogestrel-ethinyl estradiol (APRI) 0.15-30 MG-MCG tablet Take 1 tablet by mouth daily. 04/04/17   Hermina Staggers, MD  ibuprofen (ADVIL,MOTRIN) 800 MG tablet Take 1 tablet (800 mg total) by mouth every 8 (eight) hours as needed. 03/01/17   Armando Reichert, CNM    Family History Family History  Problem Relation Age of Onset  . Hypertension Mother   . Hypertension Father   . Colon cancer Maternal Grandmother   . Heart failure Maternal Grandmother   . Hypertension Maternal Grandfather   . Diabetes Maternal Grandfather   . Lung cancer Paternal Grandmother   . Hypertension Other     Social History Social History   Tobacco Use  . Smoking status: Former Smoker    Types: Cigarettes    Last attempt to quit: 11/22/2016    Years since quitting: 0.5  .  Smokeless tobacco: Never Used  Substance Use Topics  . Alcohol use: No    Alcohol/week: 0.0 oz  . Drug use: No     Allergies   Patient has no known allergies.   Review of Systems Review of Systems  All other systems reviewed and are negative.    Physical Exam Updated Vital Signs BP 133/85   Pulse 96   Temp 98.7 F (37.1 C) (Oral)   Resp 20   Ht  (1.6 m)   Wt 127 kg (280 lb)   LMP 06/18/2017 (Exact Date)   SpO2 96%   BMI 49.60 kg/m   Physical Exam  Constitutional: She appears well-developed and well-nourished. No distress.  Morbidly obese female resting comfortably in bed in no acute discomfort.  HENT:  Head: Normocephalic and atraumatic.  Mouth/Throat:  Oropharynx is clear and moist.  Eyes: Conjunctivae are normal.  Neck: Normal range of motion. Neck supple. No JVD present.  Cardiovascular: Normal rate and regular rhythm.  Pulmonary/Chest: Effort normal. No stridor. She has wheezes. She has no rales.  Expiratory wheezes, and rhonchi heard on exam  Abdominal: Soft. She exhibits no distension. There is tenderness (Tenderness to epigastric and right upper quadrant on palpation without guarding or rebound tenderness.  Negative Murphy sign.  No pain at McBurney's point.).  Lymphadenopathy:    She has no cervical adenopathy.  Neurological: She is alert.  Skin: No rash noted.  Psychiatric: She has a normal mood and affect.  Nursing note and vitals reviewed.    ED Treatments / Results  Labs (all labs ordered are listed, but only abnormal results are displayed) Labs Reviewed  COMPREHENSIVE METABOLIC PANEL - Abnormal; Notable for the following components:      Result Value   Glucose, Bld 130 (*)    All other components within normal limits  URINALYSIS, ROUTINE W REFLEX MICROSCOPIC - Abnormal; Notable for the following components:   Color, Urine AMBER (*)    APPearance HAZY (*)    Bilirubin Urine SMALL (*)    Protein, ur 30 (*)    Bacteria, UA RARE (*)    All other components within normal limits  LIPASE, BLOOD  CBC  I-STAT BETA HCG BLOOD, ED ([facility], [facility], [facility] ONLY)    EKG None  Radiology Dg Chest 2 View  Result Date: 06/28/2017 CLINICAL DATA:  Acute onset of mid chest pain that began yesterday, associated with shortness of breath. Productive cough over the past 5 days. Current smoker. Current history of asthma. EXAM: CHEST - 2 VIEW COMPARISON:  04/13/2012. FINDINGS: Cardiomediastinal silhouette unremarkable, unchanged. Mildly prominent bronchovascular markings diffusely and moderate central peribronchial thickening, more so than on the prior examination. Lungs otherwise clear. No localized airspace consolidation. No pleural effusions. No  pneumothorax. Normal pulmonary vascularity. Visualized bony thorax intact. IMPRESSION: Moderate changes of acute bronchitis and/or asthma without evidence of focal airspace pneumonia. Electronically Signed   By: Hulan Saas M.D.   On: 06/28/2017 08:41   US Abdomen Limited  Result Date: 06/28/2017 CLINICAL DATA:  22 year old presenting with acute onset of RIGHT UPPER quadrant pain and nausea that began yesterday. EXAM: ULTRASOUND ABDOMEN LIMITED RIGHT UPPER QUADRANT COMPARISON:  None. FINDINGS: Gallbladder: No shadowing gallstones or echogenic sludge. No gallbladder wall thickening or pericholecystic fluid. Negative sonographic Murphy sign according to the ultrasound technologist. Common bile duct: Diameter: Approximately 5 mm diameter. No visible bile duct stones. Liver: Normal size and echotexture without focal parenchymal abnormality. Portal vein is patent on color Doppler imaging with  normal direction of blood flow towards the liver. IMPRESSION: Normal examination. Electronically Signed   By: Hulan Saas M.D.   On: 06/28/2017 10:09    Procedures Procedures (including critical care time)  Medications Ordered in ED Medications  albuterol (PROVENTIL HFA;VENTOLIN HFA) 108 (90 Base) MCG/ACT inhaler 2 puff (has no administration in time range)  albuterol (PROVENTIL) (2.5 MG/3ML) 0.083% nebulizer solution 5 mg (5 mg Nebulization Given 06/28/17 0835)  ipratropium (ATROVENT) nebulizer solution 0.5 mg (0.5 mg Nebulization Given 06/28/17 0835)  predniSONE (DELTASONE) tablet 60 mg (60 mg Oral Given 06/28/17 1111)     Initial Impression / Assessment and Plan / ED Course  I have reviewed the triage vital signs and the nursing notes.  Pertinent labs & imaging results that were available during my care of the patient were reviewed by me and considered in my medical decision making (see chart for details).     BP (!) 123/42   Pulse 95   Temp 98.7 F (37.1 C) (Oral)   Resp 20   Ht  (1.6 m)    Wt 127 kg (280 lb)   LMP 06/18/2017 (Exact Date)   SpO2 98%   BMI 49.60 kg/m    Final Clinical Impressions(s) / ED Diagnoses   Final diagnoses:  Mild persistent asthma with acute exacerbation  Bronchitis    ED Discharge Orders        Ordered    predniSONE (DELTASONE) 20 MG tablet     06/28/17 1128    benzonatate (TESSALON) 100 MG capsule  Every 8 hours     06/28/17 1128     8:30 AM Patient report flulike symptoms for the past 3 days.  She has had her flu shot.  She also complaining of abdominal pain, decreased appetite, nausea vomiting diarrhea.  She voiced concern for potential gallbladder etiology and reports strong family history of gallbladder problem.  Will obtain an abdominal limited ultrasound for further evaluation.  Will provide DuoNeb's treatment.  10:18 AM Chest x-ray demonstrate moderate changes of acute bronchitis and/or asthma without evidence of pneumonia.  Limited abdominal ultrasound with normal gallbladder and normal examination.  11:26 AM Patient will be discharged home with albuterol inhaler, course of steroid, and cough medication as treatment of her bronchitis.  This is likely viral in etiology.  Anabiotic is not indicated at this time.   Fayrene Helper, PA-C 06/28/17 1129    Azalia Bilis, MD 06/30/17 [phone]

## 2017-06-28 NOTE — ED Notes (Signed)
Pts o2 sat while ambulating was 97%-99% on RA.  Pts HR noted to be 118bpm-130bpm once ambulated.  Pt stated she did not feel walking made her more SOB.

## 2019-03-08 IMAGING — US US MFM OB FOLLOW-UP
1 series · 14 of 28 positions shown · non-contrast
Comparison: none

[Series 1: us mfm ob follow-up · 39 acquisitions, 14 frames shown]
[im 2/39]
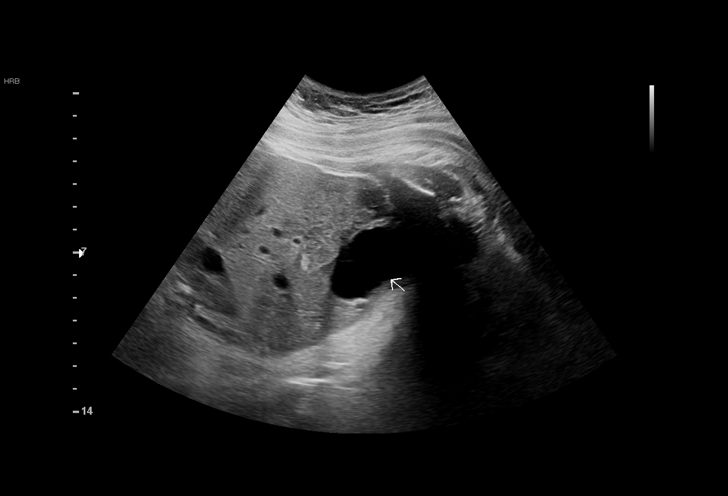
[im 5/39]
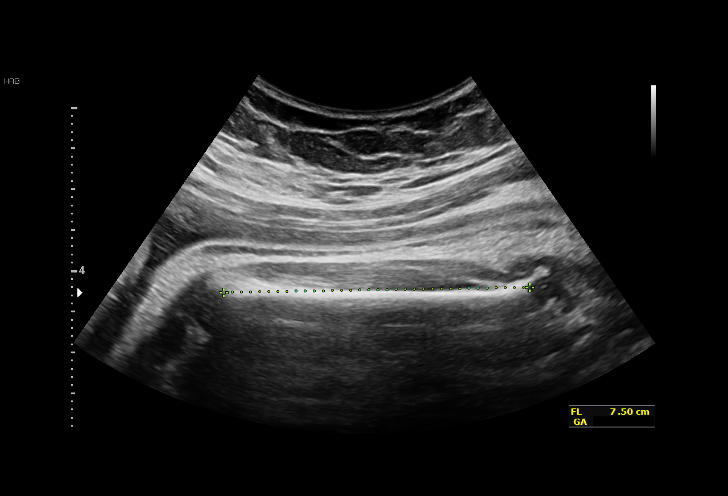
[im 8/39]
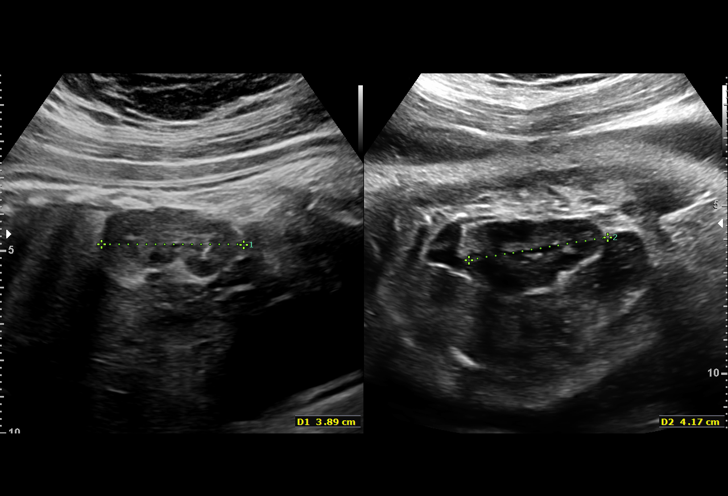
[im 10/39]
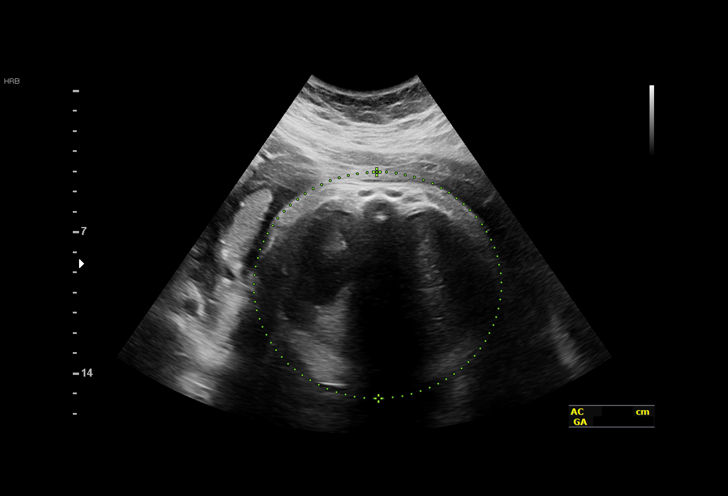
[im 13/39]
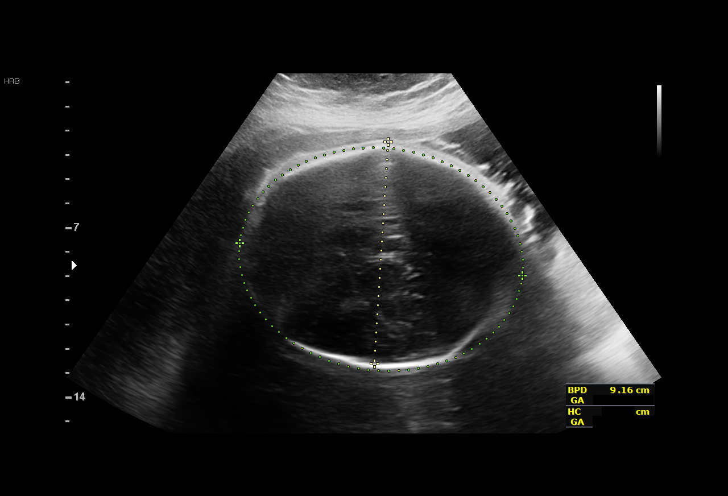
[im 16/39]
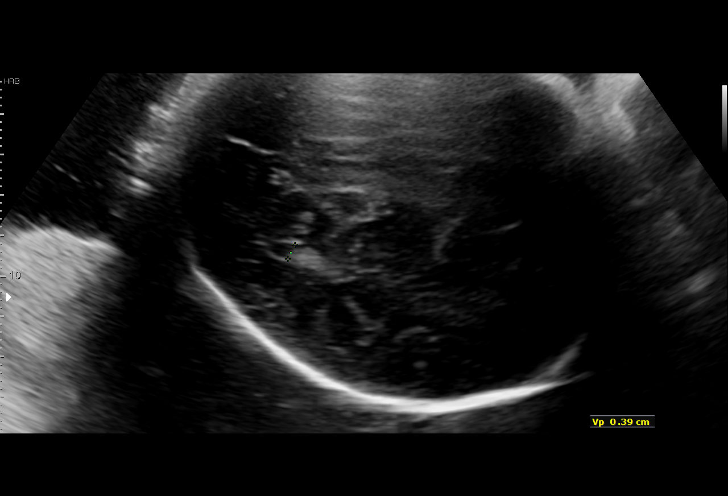
[im 19/39]
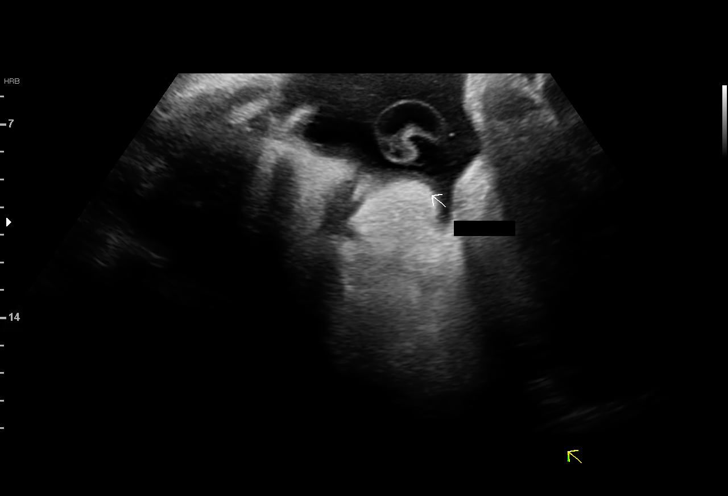
[im 22/39]
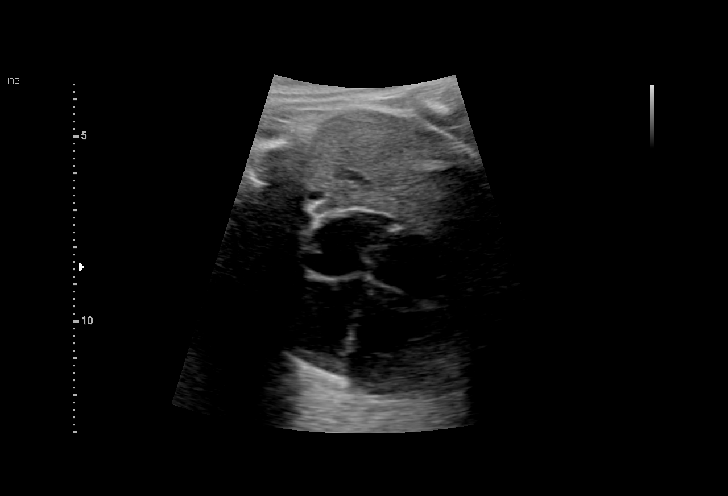
[im 24/39]
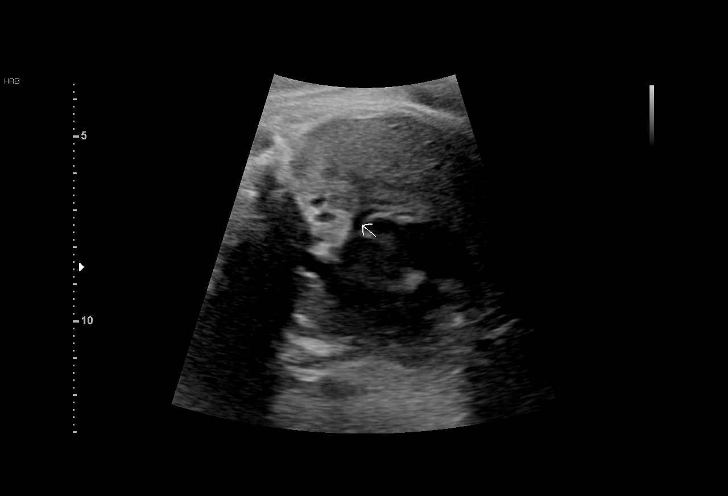
[im 27/39]
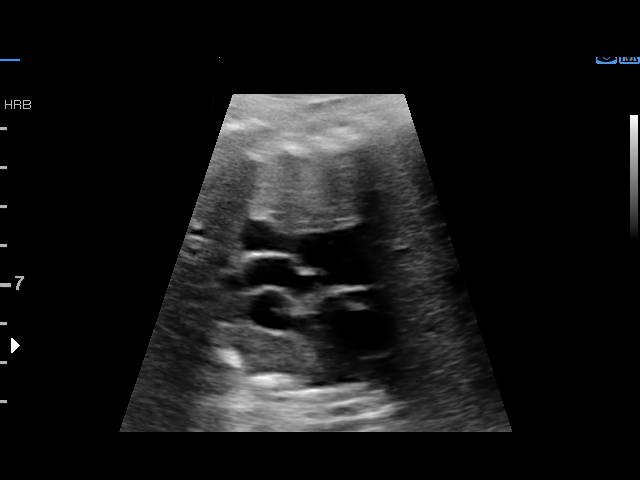
[im 30/39]
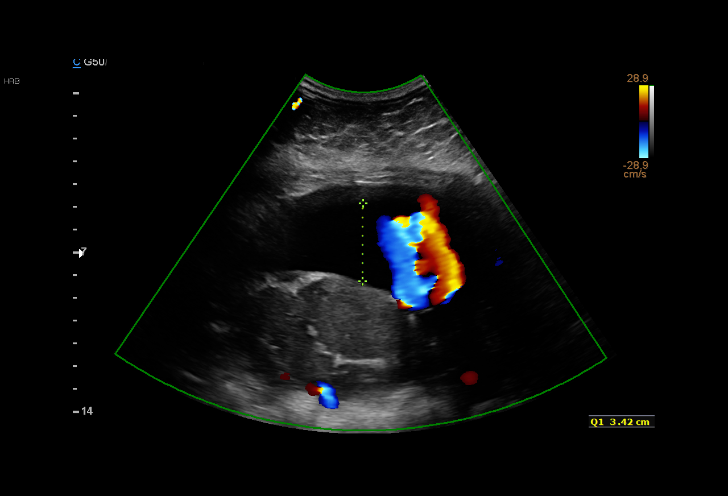
[im 33/39]
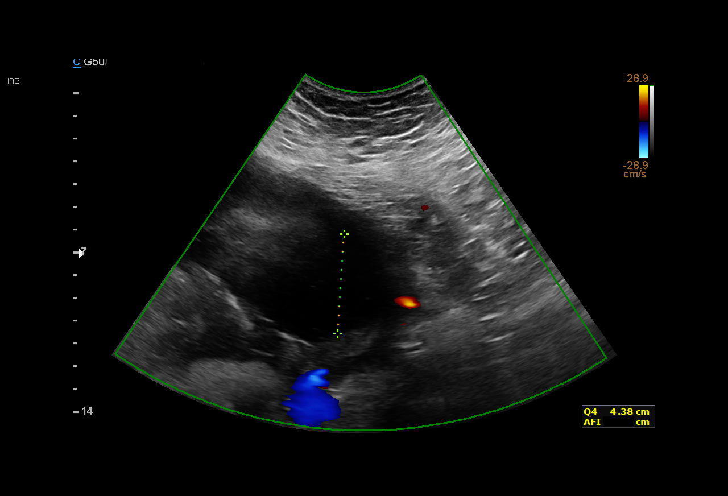
[im 36/39]
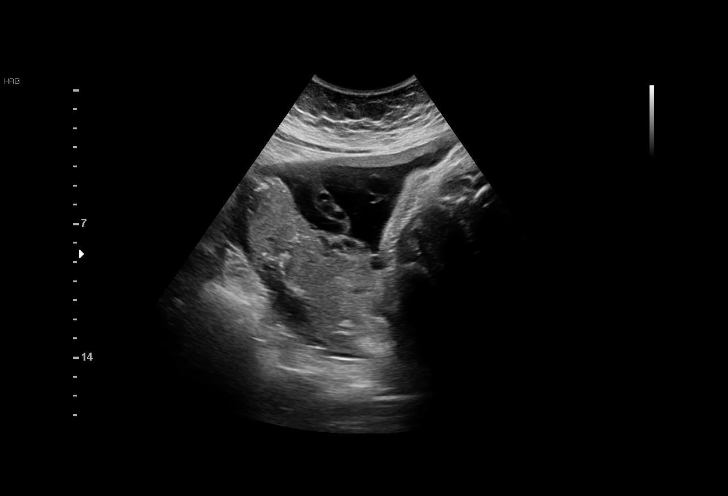
[im 39/39]
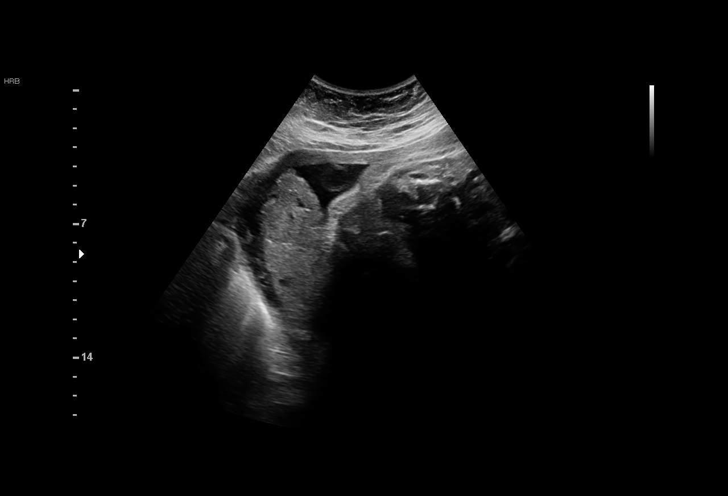

[14 of 28 positions shown; findings below may reference images not displayed]

Road [HOSPITAL]

1  NTSHAVHENI CNE              [id number]      [id number]     [id number]
Indications

36 weeks gestation of pregnancy
Gestational diabetes in pregnancy,
controlled by oral hypoglycemic drugs
Encounter for other antenatal screening
follow-up
OB History

Gravidity:    1
Fetal Evaluation

Num Of Fetuses:     1
Fetal Heart         161
Rate(bpm):
Cardiac Activity:   Observed
Presentation:       Breech, complete
Placenta:           Fundal, above cervical os
P. Cord Insertion:  Previously Visualized

Amniotic Fluid
AFI FV:      Subjectively within normal limits

AFI Sum(cm)     %Tile       Largest Pocket(cm)
19.67           75

RUQ(cm)       RLQ(cm)       LUQ(cm)        LLQ(cm)
3.42
Biometry
BPD:      91.4  mm     G. Age:  37w 1d         76  %    CI:        75.64   %    70 - 86
FL/HC:      22.3   %    20.8 -
HC:      333.2  mm     G. Age:  38w 0d         58  %    HC/AC:      0.91        0.92 -
AC:      366.8  mm     G. Age:  40w 4d       > 97  %    FL/BPD:     81.2   %    71 - 87
FL:       74.2  mm     G. Age:  38w 0d         80  %    FL/AC:      20.2   %    20 - 24
HUM:      62.5  mm     G. Age:  36w 2d         60  %

Est. FW:    2984  gm      8 lb 4 oz   > 90  %
Gestational Age

U/S Today:     38w 3d                                        EDD:   02/20/17
Best:          36w 4d     Det. By:  Previous Ultrasound      EDD:   03/05/17
(07/10/16)
Anatomy

Cranium:               Appears normal         Aortic Arch:            Previously seen
Cavum:                 Previously seen        Ductal Arch:            Previously seen
Ventricles:            Appears normal         Diaphragm:              Appears normal
Choroid Plexus:        Previously seen        Stomach:                Appears normal, left
sided
Cerebellum:            Previously seen        Abdomen:                Appears normal
Posterior Fossa:       Previously seen        Abdominal Wall:         Previously seen
Nuchal Fold:           Previously seen        Cord Vessels:           Previously seen
Face:                  Profile nl; orbits     Kidneys:                Appear normal
prev vis
Lips:                  Previously seen        Bladder:                Appears normal
Thoracic:              Appears normal         Spine:                  Previously seen
Heart:                 Appears normal         Upper Extremities:      Previously seen
(4CH, axis, and situs
RVOT:                  Appears normal         Lower Extremities:      Previously seen
LVOT:                  Appears normal

Other:  Fetus appears to be a female. 5th digit visualized previously.
Cervix Uterus Adnexa

Cervix
Not visualized (advanced GA >74wks)
Impression

Single living intrauterine pregnancy at 36 weeks 4 days.
Greater than expected interval fetal growth (EFW>90%).
Normal amniotic fluid volume.
Normal interval fetal anatomy.
Recommendations

Continue antepartum testing as scheduled

## 2019-06-05 ENCOUNTER — Emergency Department (HOSPITAL_BASED_OUTPATIENT_CLINIC_OR_DEPARTMENT_OTHER): Payer: Managed Care, Other (non HMO)

## 2019-06-05 ENCOUNTER — Encounter (HOSPITAL_BASED_OUTPATIENT_CLINIC_OR_DEPARTMENT_OTHER): Payer: Self-pay | Admitting: Emergency Medicine

## 2019-06-05 ENCOUNTER — Emergency Department (HOSPITAL_BASED_OUTPATIENT_CLINIC_OR_DEPARTMENT_OTHER)
Admission: EM | Admit: 2019-06-05 | Discharge: 2019-06-05 | Disposition: A | Discharge status: Home / Self Care | Payer: Managed Care, Other (non HMO) | Attending: Emergency Medicine | Admitting: Emergency Medicine

## 2019-06-05 ENCOUNTER — Other Ambulatory Visit: Payer: Self-pay

## 2019-06-05 DIAGNOSIS — Z87891 Personal history of nicotine dependence: Secondary | ICD-10-CM | POA: Insufficient documentation

## 2019-06-05 DIAGNOSIS — N390 Urinary tract infection, site not specified: Secondary | ICD-10-CM | POA: Diagnosis not present

## 2019-06-05 DIAGNOSIS — R109 Unspecified abdominal pain: Secondary | ICD-10-CM

## 2019-06-05 DIAGNOSIS — Z79899 Other long term (current) drug therapy: Secondary | ICD-10-CM | POA: Diagnosis not present

## 2019-06-05 DIAGNOSIS — R1032 Left lower quadrant pain: Secondary | ICD-10-CM

## 2019-06-05 LAB — URINALYSIS, ROUTINE W REFLEX MICROSCOPIC
Bilirubin Urine: NEGATIVE
Glucose, UA: NEGATIVE mg/dL
Hgb urine dipstick: NEGATIVE
Ketones, ur: NEGATIVE mg/dL
Leukocytes,Ua: NEGATIVE
Nitrite: NEGATIVE
Protein, ur: NEGATIVE mg/dL
Specific Gravity, Urine: >1.03 — ABNORMAL HIGH (ref 1.005–1.030)
pH: 6 (ref 5.0–8.0)

## 2019-06-05 LAB — BASIC METABOLIC PANEL
Anion gap: 9 (ref 5–15)
BUN: 11 mg/dL (ref 6–20)
CO2: 25 mmol/L (ref 22–32)
Calcium: 8.8 mg/dL — ABNORMAL LOW (ref 8.9–10.3)
Chloride: 101 mmol/L (ref 98–111)
Creatinine, Ser: 0.96 mg/dL (ref 0.44–1.00)
GFR calc Af Amer: >60 mL/min (ref >60)
GFR calc non Af Amer: >60 mL/min (ref >60)
Glucose, Bld: 88 mg/dL (ref 70–99)
Potassium: 4 mmol/L (ref 3.5–5.1)
Sodium: 135 mmol/L (ref 135–145)

## 2019-06-05 LAB — CBC
HCT: 39.5 % (ref 36.0–46.0)
Hemoglobin: 12.8 g/dL (ref 12.0–15.0)
MCH: 28.9 pg (ref 26.0–34.0)
MCHC: 32.4 g/dL (ref 30.0–36.0)
MCV: 89.2 fL (ref 80.0–100.0)
Platelets: 261 10*3/uL (ref 150–400)
RBC: 4.43 MIL/uL (ref 3.87–5.11)
RDW: 13.2 % (ref 11.5–15.5)
WBC: 9.6 10*3/uL (ref 4.0–10.5)
nRBC: 0 % (ref 0.0–0.2)

## 2019-06-05 LAB — PREGNANCY, URINE: Preg Test, Ur: NEGATIVE

## 2019-06-05 MED ORDER — ONDANSETRON HCL 4 MG PO TABS: 4.0000 mg | ORAL_TABLET | Freq: Three times a day (TID) | ORAL | 0 refills | Status: DC | PRN

## 2019-06-05 MED ORDER — PHENAZOPYRIDINE HCL 200 MG PO TABS: 200.0000 mg | ORAL_TABLET | Freq: Three times a day (TID) | ORAL | 0 refills | Status: DC

## 2019-06-05 MED ORDER — SODIUM CHLORIDE 0.9 % IV BOLUS
1000.0000 mL | Freq: Once | INTRAVENOUS | Status: AC
  Administered 2019-06-05: 1000 mL via INTRAVENOUS

## 2019-06-05 MED ORDER — MORPHINE SULFATE (PF) 4 MG/ML IV SOLN
4.0000 mg | Freq: Once | INTRAVENOUS | Status: AC
  Administered 2019-06-05: 15:00:00 4 mg via INTRAVENOUS
  Filled 2019-06-05: qty 1

## 2019-06-05 MED ORDER — ONDANSETRON HCL 4 MG/2ML IJ SOLN
4.0000 mg | Freq: Once | INTRAMUSCULAR | Status: AC
  Administered 2019-06-05: 15:00:00 4 mg via INTRAVENOUS
  Filled 2019-06-05: qty 2

## 2019-06-05 NOTE — ED Notes (Signed)
Pt drinking water for fluid challenge

## 2019-06-05 NOTE — ED Triage Notes (Addendum)
PT presents with c/o left flank since Sunday, pt went to her PCP and was given antibiotics for UTI and pain meds and is unable to keep down meds. PT states pain is worse and has blood in urine

## 2019-06-05 NOTE — Discharge Instructions (Addendum)
Please follow with your primary care doctor in the next 2 days for a check-up. They must obtain records for further management.  ° °Do not hesitate to return to the Emergency Department for any new, worsening or concerning symptoms.  ° °

## 2019-06-05 NOTE — ED Notes (Signed)
ED Provider at bedside. 

## 2019-06-05 NOTE — ED Provider Notes (Signed)
[facility] EMERGENCY DEPARTMENT Provider Note   CSN: [id number] Arrival date & time: 06/05/19  1220     History Chief Complaint  Patient presents with  . Flank Pain     HPI   Blood pressure 139/74, pulse 93, temperature 98.5 F (36.9 C), temperature source Oral, resp. rate 17, height 5\' 3"  (1.6 m), weight 98.4 kg, last menstrual period 05/16/2019, SpO2 100 %, unknown if currently breastfeeding.  Carol Zhang is a 24 y.o. female complaining of worsening colicky left flank pain over the course of the last 3 days.  Pain started 3 days ago she saw her primary care on the second day of pain they thought that she might have a kidney stone and a urinary tract infection (she states that her UA showed 500 leukocytes; no data in epic chart review) they started her on Bactrim and tramadol.  She had a shot of antibiotics in the clinic.  She states that they also did an x-ray and ultrasound and it "did not show anything."  She is never had kidney stones before.  She denies fevers or chills but she had an episode of emesis this morning when the pain was very severe.  She states that the pain comes and goes without warning but also it sometimes positional.  She initially thought that she pulled a muscle.  She denies any change in defecation no dysuria, hematuria, sensation of incomplete void.  She states that the pain is getting much worse.  Also notes a mild global headache which she typically gets when she is menstruating.     Past Medical History:  Diagnosis Date  . Asthma   . Gestational diabetes    glyburide    Patient Active Problem List   Diagnosis Date Noted  . Postpartum care and examination 04/04/2017  . Encounter for initial prescription of contraceptive pills 04/04/2017  . Hemorrhoids during pregnancy 02/07/2017  . Impaired glucose tolerance test 12/29/2016  . Low vitamin D level 08/17/2016  . Maternal varicella, non-immune 08/17/2016  . Asthma 08/15/2016  .  Seizure-like activity ([place]) 06/04/2015  . Acanthosis nigricans 06/04/2015  . Morbid obesity ([place]) 06/04/2015  . Prediabetes 06/04/2015  . Episodic tension-type headache, not intractable 06/04/2015    Past Surgical History:  Procedure Laterality Date  . WISDOM TOOTH EXTRACTION       OB History    Gravida  1   Para  1   Term  1   Preterm      AB      Living  1     SAB      TAB      Ectopic      Multiple  0   Live Births  1           Family History  Problem Relation Age of Onset  . Hypertension Mother   . Hypertension Father   . Colon cancer Maternal Grandmother   . Heart failure Maternal Grandmother   . Hypertension Maternal Grandfather   . Diabetes Maternal Grandfather   . Lung cancer Paternal Grandmother   . Hypertension Other     Social History   Tobacco Use  . Smoking status: Former Smoker    Types: Cigarettes    Quit date: 11/22/2016    Years since quitting: 2.5  . Smokeless tobacco: Never Used  Substance Use Topics  . Alcohol use: No    Alcohol/week: 0.0 standard drinks  . Drug use: No    Home Medications  Prior to Admission medications   Medication Sig Start Date End Date Taking? Authorizing Provider  lisdexamfetamine (VYVANSE) 70 MG capsule Take 70 mg by mouth daily.   Yes [provider]  sulfamethoxazole-trimethoprim (BACTRIM DS) 800-160 MG tablet Take 1 tablet by mouth 2 (two) times daily.   Yes [provider]  acetaminophen (TYLENOL) 325 MG tablet Take 650 mg by mouth every 6 (six) hours as needed for mild pain or fever.    [provider]  albuterol (PROVENTIL HFA;VENTOLIN HFA) 108 (90 BASE) MCG/ACT inhaler Inhale 2 puffs into the lungs every 6 (six) hours as needed for wheezing or shortness of breath.    [provider]  albuterol (PROVENTIL) (2.5 MG/3ML) 0.083% nebulizer solution Take 2.5 mg by nebulization every 6 (six) hours as needed for wheezing or shortness of breath.    [provider]  phenazopyridine (PYRIDIUM) 200 MG tablet Take 1 tablet (200 mg total) by mouth 3 (three) times daily. 06/05/19   Laira Penninger, Joni Reining, PA-C    Allergies    Patient has no known allergies.  Review of Systems   Review of Systems  A complete review of systems was obtained and all systems are negative except as noted in the HPI and PMH.    Physical Exam Updated Vital Signs BP (!) 101/49 (BP Location: Left Arm)   Pulse 72   Temp 98.5 F (36.9 C) (Oral)   Resp 16   Ht 5\' 3"  (1.6 m)   Wt 98.4 kg   LMP 05/16/2019   SpO2 99%   BMI 38.44 kg/m   Physical Exam Vitals and nursing note reviewed.  Constitutional:      General: She is not in acute distress.    Appearance: She is well-developed.  HENT:     Head: Normocephalic and atraumatic.  Eyes:     Conjunctiva/sclera: Conjunctivae normal.     Pupils: Pupils are equal, round, and reactive to light.     Comments: No TTP of maxillary or frontal sinuses  No TTP or induration of temporal arteries bilaterally  Neck:     Comments: FROM to C-spine. Pt can touch chin to chest without discomfort. No TTP of midline cervical spine.  Cardiovascular:     Rate and Rhythm: Normal rate and regular rhythm.  Pulmonary:     Effort: Pulmonary effort is normal. No respiratory distress.     Breath sounds: Normal breath sounds. No stridor. No wheezing or rales.  Chest:     Chest wall: No tenderness.  Abdominal:     General: Bowel sounds are normal. There is no distension.     Palpations: Abdomen is soft. There is no mass.     Tenderness: There is abdominal tenderness. There is no guarding or rebound.     Hernia: No hernia is present.     Comments: Mild tenderness on the left upper and left lower quadrant with no guarding or rebound.  Genitourinary:    Comments: No CVA tenderness to percussion bilaterally Musculoskeletal:        General: No tenderness. Normal range of motion.     Cervical back: Normal range of motion and neck  supple.  Neurological:     Mental Status: She is alert and oriented to person, place, and time.     Cranial Nerves: No cranial nerve deficit.     Comments: II-Visual fields grossly intact. III/IV/VI-Extraocular movements intact.  Pupils reactive bilaterally. V/VII-Smile symmetric, equal eyebrow raise,  facial sensation intact VIII- Hearing grossly intact  IX/X-Normal gag XI-bilateral shoulder shrug XII-midline tongue extension Motor: 5/5 bilaterally with normal tone and bulk Cerebellar: Normal finger-to-nose  and normal heel-to-shin test.   Romberg negative Ambulates with a coordinated gait      ED Results / Procedures / Treatments   Labs (all labs ordered are listed, but only abnormal results are displayed) Labs Reviewed  URINALYSIS, ROUTINE W REFLEX MICROSCOPIC - Abnormal; Notable for the following components:      Result Value   Specific Gravity, Urine >1.030 (*)    All other components within normal limits  BASIC METABOLIC PANEL - Abnormal; Notable for the following components:   Calcium 8.8 (*)    All other components within normal limits  URINE CULTURE  PREGNANCY, URINE  CBC    EKG None  Radiology CT Renal Stone Study  Result Date: 06/05/2019 CLINICAL DATA:  Left flank pain since Sunday EXAM: CT ABDOMEN AND PELVIS WITHOUT CONTRAST TECHNIQUE: Multidetector CT imaging of the abdomen and pelvis was performed following the standard protocol without IV contrast. COMPARISON:  CT 05/09/2012 FINDINGS: Lower chest: Lung bases are clear. Normal heart size. No pericardial effusion. Hepatobiliary: No focal liver abnormality is seen. No gallstones, gallbladder wall thickening, or biliary dilatation. Pancreas: Unremarkable. No pancreatic ductal dilatation or surrounding inflammatory changes. Spleen: Normal in size without focal abnormality. Adrenals/Urinary Tract: Normal adrenal glands. Normal symmetric appearance of the kidneys in normal position. No visible or contour deforming  renal lesions. No visible obstructive urolithiasis or hydronephrosis is seen. Urinary bladder is unremarkable. Stomach/Bowel: Distal esophagus, stomach and duodenal sweep are unremarkable. No small bowel wall thickening or dilatation. No evidence of obstruction. Normal appendix seen curling about the cecal tip. Moderate volume of stool throughout the colon. No colonic dilatation or wall thickening. Few noninflamed colonic diverticular are present. Vascular/Lymphatic: The aorta is normal caliber. No suspicious or enlarged lymph nodes in the included lymphatic chains. Reproductive: Normal anteverted uterus. No worrisome adnexal lesions. Other: No abdominopelvic free fluid or air. No bowel containing hernias. Mild ventral diastasis recti. Musculoskeletal: Multilevel degenerative changes are present in the imaged portions of the spine. No acute osseous abnormality or suspicious osseous lesion. IMPRESSION: 1. No visible obstructive urolithiasis or hydronephrosis. No perinephric stranding or other worrisome renal abnormality. 2. Moderate volume of stool throughout the colon. Correlate for constipation. Electronically Signed   By: Kreg Shropshire M.D.   On: 06/05/2019 15:18   US PELVIC COMPLETE W TRANSVAGINAL AND TORSION R/O  Result Date: 06/05/2019 CLINICAL DATA:  Left-sided flank and adnexal pain for several days EXAM: TRANSABDOMINAL AND TRANSVAGINAL ULTRASOUND OF PELVIS DOPPLER ULTRASOUND OF OVARIES TECHNIQUE: Both transabdominal and transvaginal ultrasound examinations of the pelvis were performed. Transabdominal technique was performed for global imaging of the pelvis including uterus, ovaries, adnexal regions, and pelvic cul-de-sac. It was necessary to proceed with endovaginal exam following the transabdominal exam to visualize the ovaries. Color and duplex Doppler ultrasound was utilized to evaluate blood flow to the ovaries. COMPARISON:  CT from earlier in the same day. FINDINGS: Uterus Measurements: 8.4 x 3.4 x  4.9 cm. = volume: 81 mL. No fibroids or other mass visualized. Endometrium Thickness: 14 mm.  No focal abnormality visualized. Right ovary Measurements: 3.7 x 2.6 x 2.2 cm. = volume: 11.3 mL. Normal appearance/no adnexal mass. Left ovary Measurements: 3.2 x 1.7 x 2.0 cm = volume: 5.5 mL. Normal appearance/no adnexal mass. Pulsed Doppler evaluation of both ovaries demonstrates normal low-resistance arterial and venous waveforms. Other findings Mild free fluid is noted likely physiologic in  nature given the patient's age. IMPRESSION: No acute abnormality noted. Electronically Signed   By: Alcide Clever M.D.   On: 06/05/2019 18:00    Procedures Procedures (including critical care time)  Medications Ordered in ED Medications  sodium chloride 0.9 % bolus 1,000 mL (0 mLs Intravenous Stopped 06/05/19 1638)  ondansetron (ZOFRAN) injection 4 mg (4 mg Intravenous Given 06/05/19 1431)  morphine 4 MG/ML injection 4 mg (4 mg Intravenous Given 06/05/19 1431)    ED Course  I have reviewed the triage vital signs and the nursing notes.  Pertinent labs & imaging results that were available during my care of the patient were reviewed by me and considered in my medical decision making (see chart for details).    MDM Rules/Calculators/A&P                       Vitals:   06/05/19 1244 06/05/19 1436 06/05/19 1611 06/05/19 1815  BP:  (!) 109/57 (!) 102/51 (!) 101/49  Pulse:  83 81 72  Resp:  18 16 16   Temp:      TempSrc:      SpO2:  100% 100% 99%  Weight: 98.4 kg     Height: 5\' 3"  (1.6 m)       Medications  sodium chloride 0.9 % bolus 1,000 mL (0 mLs Intravenous Stopped 06/05/19 1638)  ondansetron (ZOFRAN) injection 4 mg (4 mg Intravenous Given 06/05/19 1431)  morphine 4 MG/ML injection 4 mg (4 mg Intravenous Given 06/05/19 1431)    HARUYE LAINEZ is 24 y.o. female presenting with worsening colicky left flank pain onset 3 days ago seen by PCP and started on Bactrim and tramadol for presumed UTI with renal  colic.  Patient alert, afebrile, nontoxic-appearing, abdominal exam nonsurgical with some mild tenderness to palpation in the left upper and left lower quadrant.  Urinalysis is without signs of infection, culture pending.  She has been on Bactrim for 3 days.  Will obtain noncontrasted CT to evaluate for stone.  Will give fluids and IV Zofran and morphine for pain control  Blood work reassuring, CT does not show a stone.  Will p.o. challenge.  We will also obtain pelvic ultrasound to evaluate for torsion.  Ultrasound negative, patient will follow closely with PCP.  Evaluation does not show pathology that would require ongoing emergent intervention or inpatient treatment. Pt is hemodynamically stable and mentating appropriately. Discussed findings and plan with patient/guardian, who agrees with care plan. All questions answered. Return precautions discussed and outpatient follow up given.      Final Clinical Impression(s) / ED Diagnoses Final diagnoses:  Left sided abdominal pain  Urinary tract infection without hematuria, site unspecified  Pain, abdominal, LLQ    Rx / DC Orders ED Discharge Orders         Ordered    ondansetron (ZOFRAN) 4 MG tablet  Every 8 hours PRN,   Status:  Discontinued     06/05/19 1614    phenazopyridine (PYRIDIUM) 200 MG tablet  3 times daily,   Status:  Discontinued     06/05/19 1614    phenazopyridine (PYRIDIUM) 200 MG tablet  3 times daily     06/05/19 1830           Gaylon Melchor, 06/07/19 06/05/19 2301    Mardella Layman, DO 06/06/19 [phone]

## 2019-06-06 LAB — URINE CULTURE: Culture: NO GROWTH

## 2020-11-05 ENCOUNTER — Other Ambulatory Visit: Payer: Self-pay

## 2020-11-05 ENCOUNTER — Encounter (HOSPITAL_COMMUNITY): Payer: Self-pay | Admitting: Family Medicine

## 2020-11-05 ENCOUNTER — Inpatient Hospital Stay (HOSPITAL_COMMUNITY): Payer: Medicaid Other

## 2020-11-05 ENCOUNTER — Inpatient Hospital Stay (HOSPITAL_COMMUNITY)
Admission: AD | Admit: 2020-11-05 | Discharge: 2020-11-05 | Disposition: A | Discharge status: Home / Self Care | Payer: Medicaid Other | Attending: Family Medicine | Admitting: Family Medicine

## 2020-11-05 DIAGNOSIS — O200 Threatened abortion: Secondary | ICD-10-CM | POA: Diagnosis not present

## 2020-11-05 DIAGNOSIS — Z3A01 Less than 8 weeks gestation of pregnancy: Secondary | ICD-10-CM | POA: Insufficient documentation

## 2020-11-05 DIAGNOSIS — Z79899 Other long term (current) drug therapy: Secondary | ICD-10-CM | POA: Insufficient documentation

## 2020-11-05 DIAGNOSIS — Z87891 Personal history of nicotine dependence: Secondary | ICD-10-CM | POA: Diagnosis not present

## 2020-11-05 DIAGNOSIS — O09291 Supervision of pregnancy with other poor reproductive or obstetric history, first trimester: Secondary | ICD-10-CM | POA: Insufficient documentation

## 2020-11-05 DIAGNOSIS — O209 Hemorrhage in early pregnancy, unspecified: Secondary | ICD-10-CM | POA: Diagnosis present

## 2020-11-05 LAB — CBC WITH DIFFERENTIAL/PLATELET
Abs Immature Granulocytes: 0.05 10*3/uL (ref 0.00–0.07)
Basophils Absolute: 0 10*3/uL (ref 0.0–0.1)
Basophils Relative: 0 %
Eosinophils Absolute: 0.1 10*3/uL (ref 0.0–0.5)
Eosinophils Relative: 1 %
HCT: 34.6 % — ABNORMAL LOW (ref 36.0–46.0)
Hemoglobin: 11.3 g/dL — ABNORMAL LOW (ref 12.0–15.0)
Immature Granulocytes: 1 %
Lymphocytes Relative: 23 %
Lymphs Abs: 2.5 10*3/uL (ref 0.7–4.0)
MCH: 26.1 pg (ref 26.0–34.0)
MCHC: 32.7 g/dL (ref 30.0–36.0)
MCV: 79.9 fL — ABNORMAL LOW (ref 80.0–100.0)
Monocytes Absolute: 1 10*3/uL (ref 0.1–1.0)
Monocytes Relative: 9 %
Neutro Abs: 7.3 10*3/uL (ref 1.7–7.7)
Neutrophils Relative %: 66 %
Platelets: 307 10*3/uL (ref 150–400)
RBC: 4.33 MIL/uL (ref 3.87–5.11)
RDW: 15.3 % (ref 11.5–15.5)
WBC: 11 10*3/uL — ABNORMAL HIGH (ref 4.0–10.5)
nRBC: 0 % (ref 0.0–0.2)

## 2020-11-05 LAB — COMPREHENSIVE METABOLIC PANEL
ALT: 17 U/L (ref 0–44)
AST: 17 U/L (ref 15–41)
Albumin: 3.7 g/dL (ref 3.5–5.0)
Alkaline Phosphatase: 55 U/L (ref 38–126)
Anion gap: 10 (ref 5–15)
BUN: 7 mg/dL (ref 6–20)
CO2: 23 mmol/L (ref 22–32)
Calcium: 9.1 mg/dL (ref 8.9–10.3)
Chloride: 103 mmol/L (ref 98–111)
Creatinine, Ser: 0.72 mg/dL (ref 0.44–1.00)
GFR, Estimated: >60 mL/min (ref >60)
Glucose, Bld: 90 mg/dL (ref 70–99)
Potassium: 3.9 mmol/L (ref 3.5–5.1)
Sodium: 136 mmol/L (ref 135–145)
Total Bilirubin: 0.6 mg/dL (ref 0.3–1.2)
Total Protein: 6.9 g/dL (ref 6.5–8.1)

## 2020-11-05 LAB — HCG, QUANTITATIVE, PREGNANCY: hCG, Beta Chain, Quant, S: 60207 m[IU]/mL — ABNORMAL HIGH (ref <5)

## 2020-11-05 LAB — POCT PREGNANCY, URINE: Preg Test, Ur: POSITIVE — AB

## 2020-11-05 NOTE — MAU Provider Note (Addendum)
[facility] OB/[facility]N Attending MAU Note  Chief Complaint: Vaginal Bleeding   None   SUBJECTIVE Carol Zhang is a 25 y.o. G3P1011 at [redacted]w[redacted]d by LMP who presents with bleeding today. She had intercourse 2 days ago. No significant pain. H/o GDM in last pregnancy and TAB. SVD x 1. She has a blood type which is O pos.  Past Medical History:  Diagnosis Date   Asthma    Gestational diabetes    glyburide   OB History  Gravida Para Term Preterm AB Living  3 1 1   1 1   SAB IAB Ectopic Multiple Live Births    1   0 1    # Outcome Date GA Lbr Len/2nd Weight Sex Delivery Anes PTL Lv  3 Current           2 Term 02/27/17 107w1d 15:41 / 01:38 3521 g F Vag-Spont EPI, Local  LIV  1 IAB            Past Surgical History:  Procedure Laterality Date   WISDOM TOOTH EXTRACTION     Social History   Socioeconomic History   Marital status: Single    Spouse name: Not on file   Number of children: Not on file   Years of education: Not on file   Highest education level: Not on file  Occupational History   Not on file  Tobacco Use   Smoking status: Former    Types: Cigarettes    Quit date: 11/22/2016    Years since quitting: 3.9   Smokeless tobacco: Never  Vaping Use   Vaping Use: Every day  Substance and Sexual Activity   Alcohol use: No    Alcohol/week: 0.0 standard drinks   Drug use: No   Sexual activity: Yes    Partners: Male    Birth control/protection: None  Other Topics Concern   Not on file  Social History Narrative   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 She is doing very well. She lives with her dad and she has six siblings, 4 brothers & 2 sisters. She enjoys driving, watching Netflix and hanging with friends.    Social Determinants of Health   Financial Resource Strain: Not on file  Food Insecurity: Not on file  Transportation Needs: Not on file  Physical Activity: Not on file  Stress: Not on file  Social Connections: Not on file  Intimate Partner  Violence: Not on file   No current facility-administered medications on file prior to encounter.   Current Outpatient Medications on File Prior to Encounter  Medication Sig Dispense Refill   acetaminophen (TYLENOL) 325 MG tablet Take 650 mg by mouth every 6 (six) hours as needed for mild pain or fever.     albuterol (PROVENTIL HFA;VENTOLIN HFA) 108 (90 BASE) MCG/ACT inhaler Inhale 2 puffs into the lungs every 6 (six) hours as needed for wheezing or shortness of breath.     albuterol (PROVENTIL) (2.5 MG/3ML) 0.083% nebulizer solution Take 2.5 mg by nebulization every 6 (six) hours as needed for wheezing or shortness of breath.     lisdexamfetamine (VYVANSE) 70 MG capsule Take 70 mg by mouth daily.     phenazopyridine (PYRIDIUM) 200 MG tablet Take 1 tablet (200 mg total) by mouth 3 (three) times daily. 6 tablet 0   sulfamethoxazole-trimethoprim (BACTRIM DS) 800-160 MG tablet Take 1 tablet by mouth 2 (two) times daily.     No Known Allergies  ROS: Pertinent items in HPI  OBJECTIVE BP (!) 107/51   Pulse 61   Temp 98.1 F (36.7 C)   Resp 18   Ht 5\' 3"  (1.6 m)   Wt 116.1 kg   LMP 09/16/2020   SpO2 100%   BMI 45.35 kg/m  CONSTITUTIONAL: Well-developed, well-nourished female in no acute distress.  HENT:  Normocephalic, atraumatic, External right and left ear normal. Oropharynx is clear and moist EYES: Conjunctivae and EOM are normal.  No scleral icterus.  NECK: Normal range of motion, supple, no masses.  Normal thyroid.  SKIN: Skin is warm and dry. No rash noted. Not diaphoretic. No erythema. No pallor. NEUROLGIC: Alert and oriented to person, place, and time. Normal reflexes, muscle tone coordination. No cranial nerve deficit noted. PSYCHIATRIC: Normal mood and affect. Normal behavior. Normal judgment and thought content. CARDIOVASCULAR: Normal heart rate noted RESPIRATORY: Effort and breath sounds normal, no problems with respiration noted. ABDOMEN: Soft, normal bowel sounds, no  distention noted.  No tenderness, rebound or guarding.  MUSCULOSKELETAL: Normal range of motion. No tenderness.  No cyanosis, clubbing, or edema.  2+ distal pulses.  LAB RESULTS Results for orders placed or performed during the hospital encounter of 11/05/20 (from the past 48 hour(s))  Pregnancy, urine POC     Status: Abnormal   Collection Time: 11/05/20  7:19 PM  Result Value Ref Range   Preg Test, Ur POSITIVE (A) NEGATIVE    Comment:        THE SENSITIVITY OF THIS METHODOLOGY IS >24 mIU/mL   CBC with Differential/Platelet     Status: Abnormal   Collection Time: 11/05/20  9:23 PM  Result Value Ref Range   WBC 11.0 (H) 4.0 - 10.5 K/uL   RBC 4.33 3.87 - 5.11 MIL/uL   Hemoglobin 11.3 (L) 12.0 - 15.0 g/dL   HCT 11/07/20 (L) 25.3 - 66.4 %   MCV 79.9 (L) 80.0 - 100.0 fL   MCH 26.1 26.0 - 34.0 pg   MCHC 32.7 30.0 - 36.0 g/dL   RDW 40.3 47.4 - 25.9 %   Platelets 307 150 - 400 K/uL   nRBC 0.0 0.0 - 0.2 %   Neutrophils Relative % 66 %   Neutro Abs 7.3 1.7 - 7.7 K/uL   Lymphocytes Relative 23 %   Lymphs Abs 2.5 0.7 - 4.0 K/uL   Monocytes Relative 9 %   Monocytes Absolute 1.0 0.1 - 1.0 K/uL   Eosinophils Relative 1 %   Eosinophils Absolute 0.1 0.0 - 0.5 K/uL   Basophils Relative 0 %   Basophils Absolute 0.0 0.0 - 0.1 K/uL   Immature Granulocytes 1 %   Abs Immature Granulocytes 0.05 0.00 - 0.07 K/uL    Comment: Performed at [facility], [address] [place], [place]  hCG, quantitative, pregnancy     Status: Abnormal   Collection Time: 11/05/20  9:23 PM  Result Value Ref Range   hCG, Beta Chain, Quant, S 60,207 (H) <5 mIU/mL    Comment:          GEST. AGE      CONC.  (mIU/mL)   <=1 WEEK        5 - 50     2 WEEKS       50 - 500     3 WEEKS       100 - 10,000     4 WEEKS     1,000 - 30,000     5 WEEKS     3,500 -  115,000   6-8 WEEKS     12,000 - 270,000    12 WEEKS     15,000 - 220,000        FEMALE AND NON-PREGNANT FEMALE:     LESS THAN 5 mIU/mL Performed at  [facility], [address], [place], [place] [address]   Comprehensive metabolic panel     Status: None   Collection Time: 11/05/20  9:23 PM  Result Value Ref Range   Sodium 136 135 - 145 mmol/L   Potassium 3.9 3.5 - 5.1 mmol/L   Chloride 103 98 - 111 mmol/L   CO2 23 22 - 32 mmol/L   Glucose, Bld 90 70 - 99 mg/dL    Comment: Glucose reference range applies only to samples taken after fasting for at least 8 hours.   BUN 7 6 - 20 mg/dL   Creatinine, Ser 5.95 0.44 - 1.00 mg/dL   Calcium 9.1 8.9 - 63.8 mg/dL   Total Protein 6.9 6.5 - 8.1 g/dL   Albumin 3.7 3.5 - 5.0 g/dL   AST 17 15 - 41 U/L   ALT 17 0 - 44 U/L   Alkaline Phosphatase 55 38 - 126 U/L   Total Bilirubin 0.6 0.3 - 1.2 mg/dL   GFR, Estimated >75 >64 mL/min    Comment: (NOTE) Calculated using the CKD-EPI Creatinine Equation (2021)    Anion gap 10 5 - 15    Comment: Performed at [facility], [address] [place], [place] [address]    IMAGING [place] LESS THAN 14 WEEKS WITH [place] TRANSVAGINAL  Result Date: 11/05/2020 CLINICAL DATA:  Vaginal spotting. EXAM: OBSTETRIC <14 WK [place] AND TRANSVAGINAL OB US TECHNIQUE: Both transabdominal and transvaginal ultrasound examinations were performed for complete evaluation of the gestation as well as the maternal uterus, adnexal regions, and pelvic cul-de-sac. Transvaginal technique was performed to assess early pregnancy. COMPARISON:  None. FINDINGS: Intrauterine gestational sac: Single Yolk sac:  Visualized. Embryo:  Visualized. Cardiac Activity: Visualized. Heart Rate: 135 bpm CRL:  8.2 mm   6 w   5 d                  [place] EDC: Jun 26, 2021 Subchorionic hemorrhage:  None visualized. Maternal uterus/adnexae: The bilateral ovaries are visualized and are normal in appearance. No pelvic free fluid is seen. IMPRESSION: Single, viable intrauterine pregnancy at approximately 6 weeks and 5 days gestation by ultrasound evaluation. Electronically Signed   By: Aram Candela M.D.   On:  11/05/2020 22:37    MAU COURSE Labs and u/s reveiwed  ASSESSMENT Initially Pregnancy of Unknown location - the above labs, ultrasound done to rule out ectopic pregnancy which can be life threatening and was ruled out.  1. Threatened abortion in first trimester   2. Vaginal bleeding in pregnancy, first trimester   3. Bleeding in early pregnancy     PLAN Discharge home--at risk of miscarriage. No subchorionic hemorrhage noted. Precautions discussed at length.  Follow-up Information     Ob/Gyn, [facility] Follow up.   Contact information: [address] [place] [address] [phone]                Allergies as of 11/05/2020   No Known Allergies      Medication List     STOP taking these medications    lisdexamfetamine 70 MG capsule Commonly known as: VYVANSE   phenazopyridine 200 MG tablet Commonly known as: PYRIDIUM  sulfamethoxazole-trimethoprim 800-160 MG tablet Commonly known as: BACTRIM DS       TAKE these medications    acetaminophen 325 MG tablet Commonly known as: TYLENOL Take 650 mg by mouth every 6 (six) hours as needed for mild pain or fever.   albuterol (2.5 MG/3ML) 0.083% nebulizer solution Commonly known as: PROVENTIL Take 2.5 mg by nebulization every 6 (six) hours as needed for wheezing or shortness of breath.   albuterol 108 (90 Base) MCG/ACT inhaler Commonly known as: VENTOLIN HFA Inhale 2 puffs into the lungs every 6 (six) hours as needed for wheezing or shortness of breath.        Evaluation does not show pathology that would require ongoing emergent intervention or inpatient treatment. Patient is hemodynamically stable and mentating appropriately. Discussed findings and plan with patient, who agrees with care plan. All questions answered. Return precautions discussed and outpatient follow up recommendations given.  Reva Bores, MD 11/05/2020 11:00 PM

## 2020-11-05 NOTE — MAU Note (Signed)
Pt reports she went to [place] earlier today and had some bleeding when she wiped. Happened again this afternoon. Denies any pain or cramping.

## 2020-12-02 LAB — OB RESULTS CONSOLE ANTIBODY SCREEN: Antibody Screen: NEGATIVE

## 2020-12-02 LAB — OB RESULTS CONSOLE HIV ANTIBODY (ROUTINE TESTING): HIV: NONREACTIVE

## 2020-12-02 LAB — HEPATITIS C ANTIBODY: HCV Ab: NEGATIVE

## 2020-12-02 LAB — OB RESULTS CONSOLE RPR: RPR: NONREACTIVE

## 2020-12-02 LAB — OB RESULTS CONSOLE RUBELLA ANTIBODY, IGM
Rubella: IMMUNE
Rubella: IMMUNE

## 2020-12-02 LAB — OB RESULTS CONSOLE HEPATITIS B SURFACE ANTIGEN: Hepatitis B Surface Ag: NEGATIVE

## 2020-12-02 LAB — OB RESULTS CONSOLE ABO/RH: RH Type: POSITIVE

## 2021-02-21 NOTE — L&D Delivery Note (Signed)
Delivery Note ?She progressed to complete and pushed well.  At 9:04 PM a viable female was delivered via Vaginal, Spontaneous (Presentation: Left Occiput Anterior).  APGAR: 9, 9; weight pending.   ?Placenta status: Spontaneous, Intact.  Cord: 3 vessels with the following complications: Short.  ? ?Anesthesia: Epidural ?Episiotomy: None ?Lacerations: 1st degree ?Suture Repair: 3.0 vicryl rapide ?Est. Blood Loss (mL): 200 ? ?Mom to postpartum.  Baby to Couplet care / Skin to Skin.  They would like circumcision for the baby ? ?Carol Zhang ?06/16/2021, 9:27 PM ? ? ? ?

## 2021-04-01 LAB — OB RESULTS CONSOLE RPR: RPR: NONREACTIVE

## 2021-05-26 LAB — OB RESULTS CONSOLE GBS: GBS: POSITIVE

## 2021-05-31 LAB — OB RESULTS CONSOLE GC/CHLAMYDIA
Chlamydia: NEGATIVE
Gonorrhea: NEGATIVE

## 2021-06-14 ENCOUNTER — Non-Acute Institutional Stay (HOSPITAL_COMMUNITY)
Admission: RE | Admit: 2021-06-14 | Discharge: 2021-06-14 | Disposition: A | Discharge status: Home / Self Care | Payer: Medicaid Other | Source: Ambulatory Visit | Attending: Internal Medicine | Admitting: Internal Medicine

## 2021-06-14 DIAGNOSIS — O99019 Anemia complicating pregnancy, unspecified trimester: Secondary | ICD-10-CM | POA: Diagnosis present

## 2021-06-14 MED ORDER — METHYLPREDNISOLONE SODIUM SUCC 40 MG IJ SOLR
40.0000 mg | Freq: Once | INTRAMUSCULAR | Status: AC
  Administered 2021-06-14: 40 mg via INTRAVENOUS
  Filled 2021-06-14: qty 1

## 2021-06-14 MED ORDER — SODIUM CHLORIDE 0.9 % IV SOLN
510.0000 mg | Freq: Once | INTRAVENOUS | Status: AC
  Administered 2021-06-14: 510 mg via INTRAVENOUS
  Filled 2021-06-14: qty 510

## 2021-06-14 MED ORDER — SODIUM CHLORIDE 0.9 % IV SOLN: INTRAVENOUS | Status: DC | PRN

## 2021-06-14 NOTE — Significant Event (Signed)
Informed by primary nurse at 11 AM of possible adverse reaction to iron infusion. Patient being managed in day hospital. Upon evaluation of patient she verbalizes feeling flushed, hot and burning in mid epigastric area, similar to feeling of heartburn. Denies any other chest pain or shortness of breath. Denies any other itching or pain.  ? ?PE significant for red/ flushed bilateral facial cheeks, mild labored breathing, with no respiratory distress. AO x 3. Patient overall in no acute distress. VSS.  ? ?Primary nurse advised to discontinue iron as soon as possible and document initial VS. After documentation of initial vitals, patient to be on continuous monitoring x 20 to 60 mins. Solumedrol 40 mg once via IV ordered. Ordering provider notified, awaiting return call for further recommendations.  ? ?Safety precautions in place at this time. Will reevaluate after administration of solumedrol ? ?11:47 AM: Reevaluation of patient after administration of solumedrol demonstrates marked improvement in symptoms. Patient states that symptoms have resolved. Will continue to monitor up to 1 hr.  ?

## 2021-06-14 NOTE — Progress Notes (Signed)
Patient received  8 minutes of IV  Feraheme. Infusion was terminated (see notes). Patient is hereby discharged, printed AVS given, patient verbalized understanding. Patient is alert, oriented and ambulatory on discharge.  ?

## 2021-06-14 NOTE — Progress Notes (Signed)
Patient was started on IV Feraheme, 8 minutes into the infusion, patient complained of SOB, sweating and flushing. Infusion was stopped immediately, a provider was called in to assess the patient, a one time order of 40 mg of IV solu-medrol was ordered and the office of Ralene Bathe MD was notified. Presently patient feels better, all the symptoms have stopped. Writer spoke with Darius Bump MD's office, per MD, the infusion should remain stopped, patient told to continue taking her prescribed PO medication. Patient will be observed for 30 minutes. Thereafter patient will be discharged if vitals remains stable. Patient advised to call 911 if there is any emergency post discharged. Patient verbalized understanding. ?

## 2021-06-16 ENCOUNTER — Inpatient Hospital Stay (HOSPITAL_COMMUNITY): Payer: Medicaid Other | Admitting: Anesthesiology

## 2021-06-16 ENCOUNTER — Encounter (HOSPITAL_COMMUNITY): Payer: Self-pay | Admitting: Obstetrics and Gynecology

## 2021-06-16 ENCOUNTER — Inpatient Hospital Stay (HOSPITAL_COMMUNITY)
Admission: AD | Admit: 2021-06-16 | Discharge: 2021-06-18 | DRG: 807 | Disposition: A | Discharge status: Home / Self Care | Payer: Medicaid Other | Attending: Obstetrics and Gynecology | Admitting: Obstetrics and Gynecology

## 2021-06-16 DIAGNOSIS — O99214 Obesity complicating childbirth: Secondary | ICD-10-CM | POA: Diagnosis present

## 2021-06-16 DIAGNOSIS — Z3A39 39 weeks gestation of pregnancy: Secondary | ICD-10-CM | POA: Diagnosis not present

## 2021-06-16 DIAGNOSIS — O368130 Decreased fetal movements, third trimester, not applicable or unspecified: Principal | ICD-10-CM | POA: Diagnosis present

## 2021-06-16 DIAGNOSIS — O99824 Streptococcus B carrier state complicating childbirth: Secondary | ICD-10-CM | POA: Diagnosis present

## 2021-06-16 DIAGNOSIS — Z87891 Personal history of nicotine dependence: Secondary | ICD-10-CM

## 2021-06-16 LAB — TYPE AND SCREEN
ABO/RH(D): O POS
Antibody Screen: NEGATIVE

## 2021-06-16 LAB — CBC
HCT: 35 % — ABNORMAL LOW (ref 36.0–46.0)
Hemoglobin: 10.9 g/dL — ABNORMAL LOW (ref 12.0–15.0)
MCH: 25 pg — ABNORMAL LOW (ref 26.0–34.0)
MCHC: 31.1 g/dL (ref 30.0–36.0)
MCV: 80.3 fL (ref 80.0–100.0)
Platelets: 291 10*3/uL (ref 150–400)
RBC: 4.36 MIL/uL (ref 3.87–5.11)
RDW: 20.9 % — ABNORMAL HIGH (ref 11.5–15.5)
WBC: 9.5 10*3/uL (ref 4.0–10.5)
nRBC: 0 % (ref 0.0–0.2)

## 2021-06-16 MED ORDER — ACETAMINOPHEN 325 MG PO TABS
650.0000 mg | ORAL_TABLET | ORAL | Status: DC | PRN
  Administered 2021-06-16: 650 mg via ORAL
  Filled 2021-06-16: qty 2

## 2021-06-16 MED ORDER — LIDOCAINE HCL (PF) 1 % IJ SOLN: 30.0000 mL | INTRAMUSCULAR | Status: DC | PRN

## 2021-06-16 MED ORDER — LIDOCAINE HCL (PF) 1 % IJ SOLN
INTRAMUSCULAR | Status: DC | PRN
  Administered 2021-06-16: 6 mL via EPIDURAL

## 2021-06-16 MED ORDER — EPHEDRINE 5 MG/ML INJ: 10.0000 mg | INTRAVENOUS | Status: DC | PRN

## 2021-06-16 MED ORDER — LACTATED RINGERS IV SOLN: INTRAVENOUS | Status: DC

## 2021-06-16 MED ORDER — DIPHENHYDRAMINE HCL 50 MG/ML IJ SOLN: 12.5000 mg | INTRAMUSCULAR | Status: DC | PRN

## 2021-06-16 MED ORDER — PHENYLEPHRINE 80 MCG/ML (10ML) SYRINGE FOR IV PUSH (FOR BLOOD PRESSURE SUPPORT): 80.0000 ug | PREFILLED_SYRINGE | INTRAVENOUS | Status: DC | PRN

## 2021-06-16 MED ORDER — FENTANYL-BUPIVACAINE-NACL 0.5-0.125-0.9 MG/250ML-% EP SOLN
12.0000 mL/h | EPIDURAL | Status: DC | PRN
  Administered 2021-06-16: 12 mL/h via EPIDURAL
  Filled 2021-06-16 (×2): qty 250

## 2021-06-16 MED ORDER — PENICILLIN G POT IN DEXTROSE 60000 UNIT/ML IV SOLN
3.0000 10*6.[IU] | INTRAVENOUS | Status: DC
  Administered 2021-06-16 (×2): 3 10*6.[IU] via INTRAVENOUS
  Filled 2021-06-16 (×2): qty 50

## 2021-06-16 MED ORDER — OXYTOCIN-SODIUM CHLORIDE 30-0.9 UT/500ML-% IV SOLN
1.0000 m[IU]/min | INTRAVENOUS | Status: DC
  Administered 2021-06-16: 2 m[IU]/min via INTRAVENOUS

## 2021-06-16 MED ORDER — TERBUTALINE SULFATE 1 MG/ML IJ SOLN: 0.2500 mg | Freq: Once | INTRAMUSCULAR | Status: DC | PRN

## 2021-06-16 MED ORDER — OXYCODONE-ACETAMINOPHEN 5-325 MG PO TABS: 1.0000 | ORAL_TABLET | ORAL | Status: DC | PRN

## 2021-06-16 MED ORDER — LACTATED RINGERS IV SOLN: 500.0000 mL | INTRAVENOUS | Status: DC | PRN

## 2021-06-16 MED ORDER — OXYTOCIN BOLUS FROM INFUSION
333.0000 mL | Freq: Once | INTRAVENOUS | Status: AC
  Administered 2021-06-16: 333 mL via INTRAVENOUS

## 2021-06-16 MED ORDER — ONDANSETRON HCL 4 MG/2ML IJ SOLN: 4.0000 mg | Freq: Four times a day (QID) | INTRAMUSCULAR | Status: DC | PRN

## 2021-06-16 MED ORDER — OXYTOCIN-SODIUM CHLORIDE 30-0.9 UT/500ML-% IV SOLN
2.5000 [IU]/h | INTRAVENOUS | Status: DC
  Filled 2021-06-16: qty 500

## 2021-06-16 MED ORDER — SODIUM CHLORIDE 0.9 % IV SOLN
5.0000 10*6.[IU] | Freq: Once | INTRAVENOUS | Status: AC
  Administered 2021-06-16: 5 10*6.[IU] via INTRAVENOUS
  Filled 2021-06-16: qty 5

## 2021-06-16 MED ORDER — OXYCODONE-ACETAMINOPHEN 5-325 MG PO TABS: 2.0000 | ORAL_TABLET | ORAL | Status: DC | PRN

## 2021-06-16 MED ORDER — LACTATED RINGERS IV SOLN: 500.0000 mL | Freq: Once | INTRAVENOUS | Status: DC

## 2021-06-16 MED ORDER — BUTORPHANOL TARTRATE 1 MG/ML IJ SOLN: 1.0000 mg | INTRAMUSCULAR | Status: DC | PRN

## 2021-06-16 MED ORDER — SOD CITRATE-CITRIC ACID 500-334 MG/5ML PO SOLN
30.0000 mL | ORAL | Status: DC | PRN
Start: 2021-06-16 — End: 2021-06-16

## 2021-06-16 NOTE — Lactation Note (Signed)
This note was copied from a baby's chart. ?Lactation Consultation Note ?Assisted baby to breast. Baby latched and suckling well. ?Mom has a 24 yr old that she stated wouldn't latch, she kept spitting it out. Mom pumped for 1 month and bottle and formula fed. ?Mom hopes just to BF this baby. ?Will f/u on MBU. ? ?Patient Name: Carol Zhang ?Today's Date: 06/16/2021 ?  ?Age:45 hours ? ?Maternal Data ?  ? ?Feeding ?  ? ?LATCH Score ?  ? ?  ? ?  ? ?  ? ?  ? ?  ? ? ?Lactation Tools Discussed/Used ?  ? ?Interventions ?  ? ?Discharge ?  ? ?Consult Status ?  ? ? ? ?Charyl Dancer ?06/16/2021, 9:56 PM ? ? ? ?

## 2021-06-16 NOTE — Anesthesia Preprocedure Evaluation (Signed)
Anesthesia Evaluation  ?Patient identified by MRN, date of birth, ID band ?Patient awake ? ? ? ?Reviewed: ?Allergy & Precautions, H&P , NPO status , Patient's Chart, lab work & pertinent test results ? ?History of Anesthesia Complications ?Negative for: history of anesthetic complications ? ?Airway ?Mallampati: II ? ?TM Distance: >3 FB ? ? ? ? Dental ?  ?Pulmonary ?asthma , former smoker,  ?  ?Pulmonary exam normal ? ? ? ? ? ? ? Cardiovascular ?negative cardio ROS ? ? ?Rhythm:regular Rate:Normal ? ? ?  ?Neuro/Psych ?negative neurological ROS ? negative psych ROS  ? GI/Hepatic ?negative GI ROS, Neg liver ROS,   ?Endo/Other  ?diabetes, GestationalMorbid obesity ? Renal/GU ?  ? ?  ?Musculoskeletal ? ? Abdominal ?  ?Peds ? Hematology ?negative hematology ROS ?(+)   ?Anesthesia Other Findings ? ? Reproductive/Obstetrics ?(+) Pregnancy ? ?  ? ? ? ? ? ? ? ? ? ? ? ? ? ?  ?  ? ? ? ? ? ? ? ? ?Anesthesia Physical ?Anesthesia Plan ? ?ASA: 3 ? ?Anesthesia Plan: Epidural  ? ?Post-op Pain Management:   ? ?Induction:  ? ?PONV Risk Score and Plan:  ? ?Airway Management Planned:  ? ?Additional Equipment:  ? ?Intra-op Plan:  ? ?Post-operative Plan:  ? ?Informed Consent: I have reviewed the patients History and Physical, chart, labs and discussed the procedure including the risks, benefits and alternatives for the proposed anesthesia with the patient or authorized representative who has indicated his/her understanding and acceptance.  ? ? ? ? ? ?Plan Discussed with:  ? ?Anesthesia Plan Comments:   ? ? ? ? ? ? ?Anesthesia Quick Evaluation ? ?

## 2021-06-16 NOTE — H&P (Signed)
Carol Zhang is a 26 y.o. female, G3 P1001, EGA [redacted] weeks with EDC 5-3 presenting for induction for reduced fetal movement.  PNC complicated by obesity and anemia-on Fe.  Seen in the office today, c/o reduced fetal movement, VE 3 cm, induction recommended. ? ?OB History   ? ? Gravida  ?3  ? Para  ?1  ? Term  ?1  ? Preterm  ?   ? AB  ?1  ? Living  ?1  ?  ? ? SAB  ?   ? IAB  ?1  ? Ectopic  ?   ? Multiple  ?0  ? Live Births  ?1  ?   ?  ?  ? ?Past Medical History:  ?Diagnosis Date  ? Asthma   ? Gestational diabetes   ? glyburide  ? ?Past Surgical History:  ?Procedure Laterality Date  ? WISDOM TOOTH EXTRACTION    ? ?Family History: family history includes Colon cancer in her maternal grandmother; Diabetes in her maternal grandfather; Heart failure in her maternal grandmother; Hypertension in her father, maternal grandfather, mother, and another family member; Lung cancer in her paternal grandmother. ?Social History:  reports that she quit smoking about 4 years ago. Her smoking use included cigarettes. She has never used smokeless tobacco. She reports that she does not drink alcohol and does not use drugs. ? ? ?  ?Maternal Diabetes: No ?Genetic Screening: Normal ?Maternal Ultrasounds/Referrals: Normal ?Fetal Ultrasounds or other Referrals:  None ?Maternal Substance Abuse:  No ?Significant Maternal Medications:  None ?Significant Maternal Lab Results:  Group B Strep positive ?Other Comments:  None ? ?Review of Systems  ?Respiratory: Negative.    ?Cardiovascular: Negative.   ?Maternal Medical History:  ?Contractions: Perceived severity is mild.   ?Fetal activity: Perceived fetal activity is decreased.   ?Prenatal Complications - Diabetes: none. ? ?  ?Blood pressure 118/63, pulse 79, temperature 97.9 ?F (36.6 ?C), temperature source Oral, resp. rate 18, height 5\' 2"  (1.575 m), weight 130.6 kg, last menstrual period 09/16/2020, unknown if currently breastfeeding. ?Maternal Exam:  ?Uterine Assessment: Contraction strength is  mild.  Contraction frequency is irregular.  ?Abdomen: Patient reports no abdominal tenderness. Estimated fetal weight is 8 lbs.   ?Fetal presentation: vertex ?Introitus: Normal vulva. Normal vagina.  Amniotic fluid character: not assessed. ?Pelvis: adequate for delivery.   ? ? ?Fetal Exam ?Fetal Monitor Review: Mode: ultrasound.   ?Baseline rate: 130.  ?Variability: moderate (6-25 bpm).   ?Pattern: accelerations present and no decelerations.   ?Fetal State Assessment: Category I - tracings are normal. ? ?Physical Exam ?Vitals reviewed.  ?Constitutional:   ?   Appearance: Normal appearance.  ?Cardiovascular:  ?   Rate and Rhythm: Normal rate and regular rhythm.  ?Pulmonary:  ?   Effort: Pulmonary effort is normal. No respiratory distress.  ?Abdominal:  ?   Palpations: Abdomen is soft.  ?Genitourinary: ?   General: Normal vulva.  ?Neurological:  ?   Mental Status: She is alert.  ?  ?Prenatal labs: ?ABO, Rh: --/--/PENDING (04/26 1210) ?Antibody: PENDING (04/26 1210) ?Rubella: Immune, Immune (10/12 0000) ?RPR: Nonreactive (02/09 0000)  ?HBsAg: Negative (10/12 0000)  ?HIV: Non-reactive (10/12 0000)  ?GBS: Positive/-- (04/05 0000)  ? ?Assessment/Plan: ?IUP at 39 weeks, reduced fetal movement, for induction.  Will start pitocin, monitor progress, anticipate SVD  ? ?07-12-1991 Treven Holtman ?06/16/2021, 12:39 PM ? ? ? ? ?

## 2021-06-16 NOTE — Progress Notes (Signed)
Feeling some ctx ?Afeb, VSS ?FHT-120-130, Cat I ?VE-3/50/-2, vtx, AROM clear ?Continue pitocin, monitor progress, anticipate SVD ?

## 2021-06-16 NOTE — Anesthesia Procedure Notes (Addendum)
Epidural ?Patient location during procedure: OB ?Start time: 06/16/2021 6:02 PM ?End time: 06/16/2021 6:13 PM ? ?Staffing ?Anesthesiologist: [name], MD ?Performed: anesthesiologist  ? ?Preanesthetic Checklist ?Completed: patient identified, IV checked, risks and benefits discussed, monitors and equipment checked, pre-op evaluation and timeout performed ? ?Epidural ?Patient position: sitting ?Prep: DuraPrep ?Patient monitoring: heart rate, continuous pulse ox and blood pressure ?Approach: midline ?Location: L3-L4 ?Injection technique: LOR air ? ?Needle:  ?Needle type: Tuohy  ?Needle gauge: 17 G ?Needle length: 9 cm ?Needle insertion depth: 9 cm ?Catheter type: closed end flexible ?Catheter size: 19 Gauge ?Catheter at skin depth: 15 cm ?Test dose: negative ? ?Assessment ?Events: blood not aspirated, injection not painful, no injection resistance, no paresthesia and negative IV test ? ?Additional Notes ?First attempt at L3-4. Without ever feeling clear engagement in ligament, clear CSF was encountered at 9 cm. Needle was retracted and redirected at the same level. There was questionable loss again at 9 cm with no flow of CSF. Catheter threaded easily and no fluid aspirated via the catheter. Test dosed with 6 mL lidocaine 1% before starting infusion.Reason for block:procedure for pain ? ? ? ?

## 2021-06-17 LAB — CBC
HCT: 33.8 % — ABNORMAL LOW (ref 36.0–46.0)
Hemoglobin: 10.6 g/dL — ABNORMAL LOW (ref 12.0–15.0)
MCH: 25.2 pg — ABNORMAL LOW (ref 26.0–34.0)
MCHC: 31.4 g/dL (ref 30.0–36.0)
MCV: 80.3 fL (ref 80.0–100.0)
Platelets: 287 10*3/uL (ref 150–400)
RBC: 4.21 MIL/uL (ref 3.87–5.11)
RDW: 20.9 % — ABNORMAL HIGH (ref 11.5–15.5)
WBC: 13.5 10*3/uL — ABNORMAL HIGH (ref 4.0–10.5)
nRBC: 0 % (ref 0.0–0.2)

## 2021-06-17 LAB — RPR: RPR Ser Ql: NONREACTIVE

## 2021-06-17 MED ORDER — ONDANSETRON HCL 4 MG/2ML IJ SOLN: 4.0000 mg | INTRAMUSCULAR | Status: DC | PRN

## 2021-06-17 MED ORDER — ACETAMINOPHEN 325 MG PO TABS
650.0000 mg | ORAL_TABLET | ORAL | Status: DC | PRN
  Administered 2021-06-17 (×2): 650 mg via ORAL
  Filled 2021-06-17 (×2): qty 2

## 2021-06-17 MED ORDER — OXYCODONE HCL 5 MG PO TABS
5.0000 mg | ORAL_TABLET | ORAL | Status: DC | PRN
  Administered 2021-06-17 – 2021-06-18 (×3): 5 mg via ORAL
  Filled 2021-06-17 (×3): qty 1

## 2021-06-17 MED ORDER — OXYCODONE HCL 5 MG PO TABS
10.0000 mg | ORAL_TABLET | ORAL | Status: DC | PRN
  Administered 2021-06-18 (×2): 10 mg via ORAL
  Filled 2021-06-17 (×2): qty 2

## 2021-06-17 MED ORDER — SIMETHICONE 80 MG PO CHEW: 80.0000 mg | CHEWABLE_TABLET | ORAL | Status: DC | PRN

## 2021-06-17 MED ORDER — SENNOSIDES-DOCUSATE SODIUM 8.6-50 MG PO TABS
2.0000 | ORAL_TABLET | Freq: Every day | ORAL | Status: DC
  Administered 2021-06-17 – 2021-06-18 (×2): 2 via ORAL
  Filled 2021-06-17 (×2): qty 2

## 2021-06-17 MED ORDER — ZOLPIDEM TARTRATE 5 MG PO TABS: 5.0000 mg | ORAL_TABLET | Freq: Every evening | ORAL | Status: DC | PRN

## 2021-06-17 MED ORDER — MAGNESIUM HYDROXIDE 400 MG/5ML PO SUSP: 30.0000 mL | ORAL | Status: DC | PRN

## 2021-06-17 MED ORDER — ONDANSETRON HCL 4 MG PO TABS: 4.0000 mg | ORAL_TABLET | ORAL | Status: DC | PRN

## 2021-06-17 MED ORDER — ALBUTEROL SULFATE (2.5 MG/3ML) 0.083% IN NEBU: 2.5000 mg | INHALATION_SOLUTION | Freq: Four times a day (QID) | RESPIRATORY_TRACT | Status: DC | PRN

## 2021-06-17 MED ORDER — DIBUCAINE (PERIANAL) 1 % EX OINT: 1.0000 "application " | TOPICAL_OINTMENT | CUTANEOUS | Status: DC | PRN

## 2021-06-17 MED ORDER — BENZOCAINE-MENTHOL 20-0.5 % EX AERO
1.0000 "application " | INHALATION_SPRAY | CUTANEOUS | Status: DC | PRN
  Filled 2021-06-17: qty 56

## 2021-06-17 MED ORDER — PRENATAL MULTIVITAMIN CH
1.0000 | ORAL_TABLET | Freq: Every day | ORAL | Status: DC
  Administered 2021-06-17 – 2021-06-18 (×2): 1 via ORAL
  Filled 2021-06-17 (×2): qty 1

## 2021-06-17 MED ORDER — TETANUS-DIPHTH-ACELL PERTUSSIS 5-2.5-18.5 LF-MCG/0.5 IM SUSY: 0.5000 mL | PREFILLED_SYRINGE | Freq: Once | INTRAMUSCULAR | Status: DC

## 2021-06-17 MED ORDER — ALBUTEROL SULFATE HFA 108 (90 BASE) MCG/ACT IN AERS: 2.0000 | INHALATION_SPRAY | Freq: Four times a day (QID) | RESPIRATORY_TRACT | Status: DC | PRN

## 2021-06-17 MED ORDER — COCONUT OIL OIL: 1.0000 "application " | TOPICAL_OIL | Status: DC | PRN

## 2021-06-17 MED ORDER — WITCH HAZEL-GLYCERIN EX PADS: 1.0000 "application " | MEDICATED_PAD | CUTANEOUS | Status: DC | PRN

## 2021-06-17 MED ORDER — DIPHENHYDRAMINE HCL 25 MG PO CAPS: 25.0000 mg | ORAL_CAPSULE | Freq: Four times a day (QID) | ORAL | Status: DC | PRN

## 2021-06-17 MED ORDER — IBUPROFEN 600 MG PO TABS
600.0000 mg | ORAL_TABLET | Freq: Four times a day (QID) | ORAL | Status: DC
  Administered 2021-06-17 – 2021-06-18 (×7): 600 mg via ORAL
  Filled 2021-06-17 (×7): qty 1

## 2021-06-17 MED ORDER — MEASLES, MUMPS & RUBELLA VAC IJ SOLR: 0.5000 mL | Freq: Once | INTRAMUSCULAR | Status: DC

## 2021-06-17 MED ORDER — METHYLERGONOVINE MALEATE 0.2 MG/ML IJ SOLN: 0.2000 mg | INTRAMUSCULAR | Status: DC | PRN

## 2021-06-17 MED ORDER — METHYLERGONOVINE MALEATE 0.2 MG PO TABS: 0.2000 mg | ORAL_TABLET | ORAL | Status: DC | PRN

## 2021-06-17 NOTE — Anesthesia Postprocedure Evaluation (Signed)
Anesthesia Post Note ? ?Patient: Carol Zhang ? ?Procedure(s) Performed: AN AD HOC LABOR EPIDURAL ? ?  ? ?Patient location during evaluation: Mother Baby ?Anesthesia Type: Epidural ?Level of consciousness: oriented and awake and alert ?Pain management: pain level controlled ?Vital Signs Assessment: post-procedure vital signs reviewed and stable ?Respiratory status: spontaneous breathing, respiratory function stable and nonlabored ventilation ?Cardiovascular status: blood pressure returned to baseline and stable ?Postop Assessment: no backache, no apparent nausea or vomiting, able to ambulate and patient able to bend at knees ?Anesthetic complications: yes ?Comments:  ? 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 Options discussed including conservative management with medication, caffeine and hydration vs. Epidural blood patch. At this time she would like to continue with conservative management as her headache is currently manageable with medication. At the time of my evaluation she was sitting upright on the side of the bed. I informed her of the option to request blood patch at any time, either tomorrow before she is discharged or by coming to MAU after discharge. ? ? ?No notable events documented. ? ?Last Vitals:  ?Vitals:  ? 06/17/21 0833 06/17/21 1245  ?BP: 122/82 (!) 116/55  ?Pulse: 62 71  ?Resp:    ?Temp: 36.6 ?C 36.8 ?C  ?SpO2:    ?  ?Last Pain:  ?Vitals:  ? 06/17/21 1704  ?TempSrc:   ?PainSc: 2   ? ?Pain Goal:   ? ?  ?  ?  ?  ?  ?  ?  ? ?[name] ? ? ? ? ?

## 2021-06-17 NOTE — Lactation Note (Signed)
This note was copied from a baby's chart. ?Lactation Consultation Note ?Didn't see mom on 06/16/21 0936  ?Saw mom at 2136 on 06/16/21 ? ?Patient Name: Carol Zhang ?Today's Date: 06/17/2021 ?  ?Age:24 hours ? ?Maternal Data ?  ? ?Feeding ?  ? ?LATCH Score ?  ? ?  ? ?  ? ?  ? ?  ? ?  ? ? ?Lactation Tools Discussed/Used ?  ? ?Interventions ?  ? ?Discharge ?  ? ?Consult Status ?  ? ? ? ?Charyl Dancer ?06/17/2021, 5:39 AM ? ? ? ?

## 2021-06-17 NOTE — Progress Notes (Addendum)
Post Partum Day 1 ?Subjective: ?up ad lib, voiding, tolerating PO, + flatus, and lochia mild. She complains of generalized body aches/ back pain.  Breastfeeding. She denies blurry vision, CP or SOB. Bonding well with baby - desires circ for her ?Per Nurse pt c/o HA when sitting up - resolves when lays down. In room , pt laying down and states has no HA  ? ?Objective: ?Blood pressure 122/82, pulse 62, temperature 97.9 ?F (36.6 ?C), temperature source Axillary, resp. rate 16, height 5\' 2"  (1.575 m), weight 130.6 kg, last menstrual period 09/16/2020, SpO2 100 %, unknown if currently breastfeeding. ? ?Physical Exam:  ?General: alert, fatigued, and no distress ?Lochia: appropriate ?Uterine Fundus: firm ?Incision: n/a ?DVT Evaluation: No evidence of DVT seen on physical exam. ?Negative Homan's sign. ? ?Recent Labs  ?  06/16/21 ?1210 06/17/21 ?0622  ?HGB 10.9* 10.6*  ?HCT 35.0* 33.8*  ? ? ?Assessment/Plan: ?Plan for discharge tomorrow, Breastfeeding, and Circumcision prior to discharge ?Routine pp care  ?Pain control now ; if HA persists when up, consider anesthesia consult  ? ? LOS: 1 day  ? ?06/19/21 ?06/17/2021, 12:03 PM  ? ? ?

## 2021-06-17 NOTE — Lactation Note (Signed)
This note was copied from a baby's chart. ?Lactation Consultation Note ?Attempted to see mom while she was awake but FOB and baby sleeping, mom stated she was fixing to go to sleep. Asked mom how feedings are going mom stated good, she has been mainly BF and has given 1 bottle. ?Asked mom to call for assistance today. ? ?Patient Name: Carol Zhang ?Today's Date: 06/17/2021 ?  ?Age:24 hours ? ?Maternal Data ?  ? ?Feeding ?  ? ?LATCH Score ?  ? ?  ? ?  ? ?  ? ?  ? ?  ? ? ?Lactation Tools Discussed/Used ?  ? ?Interventions ?  ? ?Discharge ?  ? ?Consult Status ?  ? ? ? ?Charyl Dancer ?06/17/2021, 3:57 AM ? ? ? ?

## 2021-06-17 NOTE — Lactation Note (Addendum)
This note was copied from a baby's chart. ?Lactation Consultation Note ? ?Patient Name: Carol Zhang ?Today's Date: 06/17/2021 ?Reason for consult: Initial assessment;Term;Breastfeeding assistance ?Age:24 hours ? ? FOB holding sleeping infant.  ? ?MOB states that baby has formula fed twice. Baby took in 67mL at 3am and 82mL at 5 am. MOB states that she has tried to latch baby, but he just sits there with the nipple in his mouth and is not latching. MOB says that her first baby was the same way and preferred the bottle. She pumped with her first baby, but was not able to keep up with her demands. ? ?LC spoke with the parents about the importance of stimulating the breasts. LC spoke with mom about possibly needing to pump if the baby did not seem interested in latching later. MOB stated that she would be interested in being set up with a pump so that she can start stimulating her breasts.  ? ?LC encouraged the MOB to hand express and spoon feed whatever she gets to the baby.  ? ?LC setup the pump, reviewed milk storage guidelines, and washing pump parts. LC let the parents know that baby may wake up later and start cluster feeding and that she should call lactation for assistance with the latch.  ? ?Plan:  ?Mom will continue to offer her breast to baby with feeding cues. Mom will pump if baby does not latch or hand express and feed expressed milk to the baby.  ? ?Mom states that she will call lactation for assistance with latching.  ?Maternal Data ?Has patient been taught Hand Expression?: Yes ?Does the patient have breastfeeding experience prior to this delivery?: Yes ?How long did the patient breastfeed?: Pumped for about 1 month per mom ? ?Tools: Pump ?Breast pump type: Double-Electric Breast Pump ?Pump Education: Setup, frequency, and cleaning;Milk Storage ?Reason for Pumping: Baby sleepy and not interested in latching, but will formula feed. ? ?Interventions ?Interventions: Breast feeding basics reviewed;Skin  to skin;DEBP;Education ? ?  ? ?Consult Status ?Consult Status: Follow-up ?Date: 06/17/21 ?Follow-up type: In-patient ? ? ? ?Elizardo Chilson P [place], IBCLC ?06/17/2021, 8:14 AM ? ? ? ?

## 2021-06-17 NOTE — Lactation Note (Addendum)
This note was copied from a baby's chart. ?Lactation Consultation Note ?Adjusted L&D noted. ? ?Patient Name: Carol Zhang ?Today's Date: 06/17/2021 ?  ?Age:24 hours ? ?Maternal Data ?  ? ?Feeding ?  ? ?LATCH Score ?  ? ?  ? ?  ? ?  ? ?  ? ?  ? ? ?Lactation Tools Discussed/Used ?  ? ?Interventions ?  ? ?Discharge ?  ? ?Consult Status ?  ? ? ? ?Charyl Dancer ?06/17/2021, 5:37 AM ? ? ? ?

## 2021-06-18 MED ORDER — ACETAMINOPHEN 325 MG PO TABS: 650.0000 mg | ORAL_TABLET | ORAL | 0 refills | Status: AC | PRN

## 2021-06-18 MED ORDER — IBUPROFEN 600 MG PO TABS: 600.0000 mg | ORAL_TABLET | Freq: Four times a day (QID) | ORAL | 0 refills | Status: AC

## 2021-06-18 NOTE — Progress Notes (Signed)
Post Partum Day 2 ?Subjective: ?no complaints, up ad lib, and voiding ?Bottlefeeding ? ?Objective: ?Blood pressure 100/64, pulse 67, temperature 97.9 ?F (36.6 ?C), temperature source Oral, resp. rate 16, height 5\' 2"  (1.575 m), weight 130.6 kg, last menstrual period 09/16/2020, SpO2 100 %, unknown if currently breastfeeding. ? ?Physical Exam:  ?General: alert and cooperative ?Lochia: appropriate ?Uterine Fundus: firm ? ? ?Recent Labs  ?  06/16/21 ?1210 06/17/21 ?0622  ?HGB 10.9* 10.6*  ?HCT 35.0* 33.8*  ? ? ?Assessment/Plan: ?Discharge home ? ? LOS: 2 days  ? ?06/19/21 ?06/18/2021, 12:06 PM  ? ? ?

## 2021-06-18 NOTE — Discharge Summary (Signed)
? ?  Postpartum Discharge Summary ? ? ? ?   ?Patient Name: Carol Zhang ?DOB: [birth year] ?MRN: [id number] ? ?Date of admission: 06/16/2021 ?Delivery date:06/16/2021  ?Delivering provider: Jackelyn Knife, TODD  ?Date of discharge: 06/18/2021 ? ?Admitting diagnosis: Indication for care in labor or delivery [O75.9] ?Intrauterine pregnancy: [redacted]w[redacted]d     ?Secondary diagnosis:  Principal Problem: ?  Indication for care in labor or delivery ?Active Problems: ?  SVD (spontaneous vaginal delivery) ? ?Additional problems: none    ?Discharge diagnosis: Term Pregnancy Delivered                                              ?Post partum procedures: none ?Augmentation: AROM and Pitocin ?Complications: None ? ?Hospital course: Induction of Labor With Vaginal Delivery   ?26 y.o. yo [id number] at [redacted]w[redacted]d was admitted to the hospital 06/16/2021 for induction of labor.  Indication for induction:  decreased fetal movement .  Patient had an uncomplicated labor course as follows: ?Membrane Rupture Time/Date: 4:20 PM ,06/16/2021   ?Delivery Method:Vaginal, Spontaneous  ?Episiotomy: None  ?Lacerations:  1st degree  ?Details of delivery can be found in separate delivery note.  Patient had a routine postpartum course. Patient is discharged home 06/18/21. ? ?Newborn Data: ?Birth date:06/16/2021  ?Birth time:9:04 PM  ?Gender:Female  ?Living status:Living  ?Apgars:9 ,9  ?Weight:3685 g  ? ? ?Physical exam  ?Vitals:  ? 06/17/21 1245 06/17/21 1700 06/17/21 2116 06/18/21 0540  ?BP: (!) 116/55 119/65 131/75 100/64  ?Pulse: 71 76 65 67  ?Resp:  16    ?Temp: 98.2 ?F (36.8 ?C) 97.9 ?F (36.6 ?C)  97.9 ?F (36.6 ?C)  ?TempSrc: Oral Oral  Oral  ?SpO2:  100%  100%  ?Weight:      ?Height:      ? ?General: alert and cooperative ?Lochia: appropriate ?Uterine Fundus: firm ? ?Labs: ?Lab Results  ?Component Value Date  ? WBC 13.5 (H) 06/17/2021  ? HGB 10.6 (L) 06/17/2021  ? HCT 33.8 (L) 06/17/2021  ? MCV 80.3 06/17/2021  ? PLT 287 06/17/2021  ? ? ?  Latest Ref Rng & Units 11/05/2020   ?  9:23 PM  ?CMP  ?Glucose 70 - 99 mg/dL 90    ?BUN 6 - 20 mg/dL 7    ?Creatinine 0.44 - 1.00 mg/dL 4.62    ?Sodium 135 - 145 mmol/L 136    ?Potassium 3.5 - 5.1 mmol/L 3.9    ?Chloride 98 - 111 mmol/L 103    ?CO2 22 - 32 mmol/L 23    ?Calcium 8.9 - 10.3 mg/dL 9.1    ?Total Protein 6.5 - 8.1 g/dL 6.9    ?Total Bilirubin 0.3 - 1.2 mg/dL 0.6    ?Alkaline Phos 38 - 126 U/L 55    ?AST 15 - 41 U/L 17    ?ALT 0 - 44 U/L 17    ? ?Edinburgh Score: ? ?  06/17/2021  ?  3:37 PM  ?Edinburgh Postnatal Depression Scale Screening Tool  ?I have been able to laugh and see the funny side of things. 0  ?I have looked forward with enjoyment to things. 0  ?I have blamed myself unnecessarily when things went wrong. 0  ?I have been anxious or worried for no good reason. 0  ?I have felt scared or panicky for no good reason. 0  ?Things have been getting on top  of me. 0  ?I have been so unhappy that I have had difficulty sleeping. 0  ?I have felt sad or miserable. 0  ?I have been so unhappy that I have been crying. 0  ?The thought of harming myself has occurred to me. 0  ?Edinburgh Postnatal Depression Scale Total 0  ? ? ? ?After visit meds:  ?Allergies as of 06/18/2021   ?No Known Allergies ?  ? ?  ?Medication List  ?  ? ?TAKE these medications   ? ?acetaminophen 325 MG tablet ?Commonly known as: Tylenol ?Take 2 tablets (650 mg total) by mouth every 4 (four) hours as needed (for pain scale < 4). ?What changed:  ?when to take this ?reasons to take this ?  ?albuterol (2.5 MG/3ML) 0.083% nebulizer solution ?Commonly known as: PROVENTIL ?Take 2.5 mg by nebulization every 6 (six) hours as needed for wheezing or shortness of breath. ?  ?albuterol 108 (90 Base) MCG/ACT inhaler ?Commonly known as: VENTOLIN HFA ?Inhale 2 puffs into the lungs every 6 (six) hours as needed for wheezing or shortness of breath. ?  ?ibuprofen 600 MG tablet ?Commonly known as: ADVIL ?Take 1 tablet (600 mg total) by mouth every 6 (six) hours. ?  ? ?  ? ? ? ?Discharge home in  stable condition ?Infant Feeding: Bottle and Breast ?Infant Disposition:home with mother ?Discharge instruction: per After Visit Summary and Postpartum booklet. ?Activity: Advance as tolerated. Pelvic rest for 6 weeks.  ?Diet: routine diet ?Future Appointments:No future appointments. ?Follow up Visit: ? Follow-up Information   ? ? Ob/Gyn, Nestor Ramp. Schedule an appointment as soon as possible for a visit in 4 week(s).   ?Why: Routine postpartum exam ?Contact information: ?[address] ?[address] ?[place] [address] ?[phone] ? ? ?  ?  ? ?  ?  ? ?  ? ? ? ?Please schedule this patient for a In person postpartum visit in 4 weeks with the following provider: MD. ? ?Delivery mode:  Vaginal, Spontaneous  ?Anticipated Birth Control:  Unsure ? ? ?06/18/2021 ?Oliver Pila, MD ? ? ? ?

## 2021-06-18 NOTE — Lactation Note (Signed)
This note was copied from a baby's chart. ?Lactation Consultation Note ? ?Patient Name: Carol Zhang ?Today's Date: 06/18/2021 ?Reason for consult: Follow-up assessment;Exclusive pumping and bottle feeding;Term ?Age:24 hours ? ?P2 mother whose infant is now 26 hours old.  This is a term baby at 39+0 weeks.  Mother's current feeding preference is to pump and bottle feed her expressed milk.  She is supplementing with formula now. ? ?Mother has been pumping every three hours and last obtained 7 mls of expressed breast milk.  Encouraged to continue breast massage and hand expression before/after pumping to help ensure a good milk supply.  Mother verbalized understanding. ? ?Mother had no further questions/concerns at this time.  She has our OP phone number for questions after discharge.  Baby has been discharged; family is awaiting mother's discharge.  2 visitors present and her 38 year old daughter is asleep on the couch. ? ? ?Maternal Data ?  ? ?Feeding ?Mother's Current Feeding Choice: Breast Milk and Formula ?Nipple Type: Slow - flow ? ?LATCH Score ?  ? ?  ? ?  ? ?  ? ?  ? ?  ? ? ?Lactation Tools Discussed/Used ?Tools: Pump;Flanges ?Flange Size: 24 ?Breast pump type: Double-Electric Breast Pump;Manual ?Reason for Pumping: Mother's desire to pump and bottle feed ?Pumping frequency: Every three hours ?Pumped volume: 7 mL ? ?Interventions ?Interventions: Education ? ?Discharge ?Discharge Education: Engorgement and breast care ?Pump: Personal ? ?Consult Status ?Consult Status: Complete ?Date: 06/18/21 ?Follow-up type: Call as needed ? ? ? ?Zephyr Sausedo R Carmesha [place] ?06/18/2021, 11:17 AM ? ? ? ?

## 2021-06-19 ENCOUNTER — Inpatient Hospital Stay (HOSPITAL_COMMUNITY)
Admission: AD | Admit: 2021-06-19 | Discharge: 2021-06-19 | Disposition: A | Discharge status: Home / Self Care | Payer: Medicaid Other | Attending: Obstetrics and Gynecology | Admitting: Obstetrics and Gynecology

## 2021-06-19 ENCOUNTER — Inpatient Hospital Stay (EMERGENCY_DEPARTMENT_HOSPITAL): Payer: Medicaid Other | Admitting: Anesthesiology

## 2021-06-19 ENCOUNTER — Inpatient Hospital Stay (HOSPITAL_COMMUNITY): Payer: Medicaid Other | Admitting: Anesthesiology

## 2021-06-19 ENCOUNTER — Other Ambulatory Visit: Payer: Self-pay

## 2021-06-19 ENCOUNTER — Encounter (HOSPITAL_COMMUNITY): Payer: Self-pay | Admitting: Obstetrics and Gynecology

## 2021-06-19 DIAGNOSIS — O894 Spinal and epidural anesthesia-induced headache during the puerperium: Secondary | ICD-10-CM | POA: Insufficient documentation

## 2021-06-19 DIAGNOSIS — O9089 Other complications of the puerperium, not elsewhere classified: Secondary | ICD-10-CM | POA: Diagnosis present

## 2021-06-19 DIAGNOSIS — G971 Other reaction to spinal and lumbar puncture: Secondary | ICD-10-CM | POA: Diagnosis not present

## 2021-06-19 MED ORDER — METOCLOPRAMIDE HCL 5 MG/ML IJ SOLN
10.0000 mg | Freq: Once | INTRAMUSCULAR | Status: AC
  Administered 2021-06-19: 10 mg via INTRAVENOUS
  Filled 2021-06-19: qty 2

## 2021-06-19 MED ORDER — LACTATED RINGERS IV BOLUS
1000.0000 mL | Freq: Once | INTRAVENOUS | Status: AC
  Administered 2021-06-19: 1000 mL via INTRAVENOUS

## 2021-06-19 MED ORDER — SODIUM CHLORIDE 0.9 % IV SOLN
500.0000 mg | Freq: Once | INTRAVENOUS | Status: AC
  Administered 2021-06-19: 500 mg via INTRAVENOUS
  Filled 2021-06-19: qty 2

## 2021-06-19 NOTE — Anesthesia Postprocedure Evaluation (Signed)
Anesthesia Post Note ? ?Patient: Carol Zhang ? ?Procedure(s) Performed: AN AD HOC BLOOD PATCH ? ?  ? ?Patient location during evaluation: MAU ?Anesthesia Type: Epidural ?Level of consciousness: awake and alert ?Pain management: pain level controlled ?Vital Signs Assessment: post-procedure vital signs reviewed and stable ?Respiratory status: spontaneous breathing, nonlabored ventilation and respiratory function stable ?Cardiovascular status: stable ?Postop Assessment: no headache and no backache ?Anesthetic complications: no ?Comments: Epidural blood patch performed in MAU uneventfully, good pain relief post-procedure ? ? ?No notable events documented. ? ?Last Vitals:  ?Vitals:  ? 06/19/21 1823 06/19/21 1939  ?BP: 135/72 (!) 147/86  ?Pulse: 60 89  ?Resp: 18   ?Temp: 36.9 ?C   ?SpO2: 99%   ?  ?Last Pain:  ?Vitals:  ? 06/19/21 1823  ?TempSrc: Oral  ?PainSc:   ? ?Pain Goal:   ? ?  ?  ?  ?  ?  ?  ?  ? ?Tennis Must Anavey Coombes ? ? ? ? ?

## 2021-06-19 NOTE — MAU Note (Signed)
Carol Zhang is a 26 y.o. at [redacted]w[redacted]d here in MAU reporting: HA started as soon as they removed the epid catheter on Wed.  Pounding HA when upright, nauseated.  Has been throwing up.  Pain meds dull I, is only better when flat. Vag delivery  4/26 ? ?Onset of complaint: Wed ?Pain score: 10 ?Vitals:  ? 06/19/21 1823  ?BP: 135/72  ?Pulse: 60  ?Resp: 18  ?Temp: 98.5 ?F (36.9 ?C)  ?SpO2: 99%  ?   ? ?Lab orders placed from triage:  none ?

## 2021-06-19 NOTE — MAU Provider Note (Addendum)
Chief Complaint: Headache ? ? Event Date/Time  ? First Provider Initiated Contact with Patient 06/19/21 1908   ?  ?SUBJECTIVE ?HPI: Carol Zhang is a 26 y.o. [id number] at 3 days postpartum spontaneous vaginal delivery 06/16/21 who presents to [facility] reporting severe positional headache that started when epidural catheter was removed the day after delivery. ? ?Location: Occipital ?Quality: Pounding ?Severity: 10/10 on pain scale when standing, 4/10 when lying down ?Duration: 2 days ?Context: Status post epidural anesthesia ?Timing: Constant ?Modifying factors: Slight improvement with taking scheduled ibuprofen, Tylenol and p.o. caffeine beverages ?Associated signs and symptoms: Positive for nausea and vomiting, sensitivity to light, neck stiffness.  Possible slight weakness in bilateral legs but thinks it may be due to back pain.  Negative for fever, chills, vision changes, difficulties with speech or gait. ? ?Past Medical History:  ?Diagnosis Date  ? Asthma   ? Gestational diabetes   ? glyburide  ? ?OB History  ?Gravida Para Term Preterm AB Living  ?3 2 2   1 2   ?SAB IAB Ectopic Multiple Live Births  ?  1   0 2  ?  ?# Outcome Date GA Lbr Len/2nd Weight Sex Delivery Anes PTL Lv  ?3 Term 06/16/21 [redacted]w[redacted]d 04:29 / 00:15 3685 g M Vag-Spont EPI  LIV  ?2 Term 02/27/17 [redacted]w[redacted]d 15:41 / 01:38 3521 g F Vag-Spont EPI, Local  LIV  ?1 IAB           ? ?Past Surgical History:  ?Procedure Laterality Date  ? WISDOM TOOTH EXTRACTION    ? ?Social History  ? ?Socioeconomic History  ? Marital status: Single  ?  Spouse name: Not on file  ? Number of children: Not on file  ? Years of education: Not on file  ? Highest education level: Not on file  ?Occupational History  ? Not on file  ?Tobacco Use  ? Smoking status: Former  ?  Types: Cigarettes  ?  Quit date: 11/22/2016  ?  Years since quitting: 4.5  ? Smokeless tobacco: Never  ?Vaping Use  ? Vaping Use: Every day  ?Substance and Sexual Activity  ? Alcohol use: No  ?  Alcohol/week:  0.0 standard drinks  ? Drug use: No  ? Sexual activity: Yes  ?  Partners: Male  ?  Birth control/protection: None  ?Other Topics Concern  ? Not on file  ?Social History Narrative  ? Fidelia is an [employer] Son at [employer]. She is doing very well. She lives with her dad and she has six siblings, 4 brothers & 2 sisters. She enjoys driving, watching Netflix and hanging with friends.   ? ?Social Determinants of Health  ? ?Financial Resource Strain: Not on file  ?Food Insecurity: Not on file  ?Transportation Needs: Not on file  ?Physical Activity: Not on file  ?Stress: Not on file  ?Social Connections: Not on file  ?Intimate Partner Violence: Not on file  ? ?Family History  ?Problem Relation Age of Onset  ? Hypertension Mother   ? Hypertension Father   ? Colon cancer Maternal Grandmother   ? Heart failure Maternal Grandmother   ? Hypertension Maternal Grandfather   ? Diabetes Maternal Grandfather   ? Lung cancer Paternal Grandmother   ? Hypertension Other   ? ?No current facility-administered medications on file prior to encounter.  ? ?Current Outpatient Medications on File Prior to Encounter  ?Medication Sig Dispense Refill  ? acetaminophen (TYLENOL) 325 MG tablet Take 2 tablets (650 mg  total) by mouth every 4 (four) hours as needed (for pain scale < 4). 30 tablet 0  ? ibuprofen (ADVIL) 600 MG tablet Take 1 tablet (600 mg total) by mouth every 6 (six) hours. 30 tablet 0  ? albuterol (PROVENTIL HFA;VENTOLIN HFA) 108 (90 BASE) MCG/ACT inhaler Inhale 2 puffs into the lungs every 6 (six) hours as needed for wheezing or shortness of breath.    ? albuterol (PROVENTIL) (2.5 MG/3ML) 0.083% nebulizer solution Take 2.5 mg by nebulization every 6 (six) hours as needed for wheezing or shortness of breath.    ? ?No Known Allergies ? ?I have reviewed patient's Past Medical Hx, Surgical Hx, Family Hx, Social Hx, medications and allergies.  ? ?Review of Systems  ?Constitutional:  Negative for chills and fever.  ?HENT:   Negative for rhinorrhea and sinus pain.   ?Eyes:  Positive for photophobia. Negative for visual disturbance.  ?Gastrointestinal:  Positive for nausea and vomiting. Negative for abdominal pain.  ?Musculoskeletal:  Positive for back pain, neck pain and neck stiffness.  ?Neurological:  Positive for weakness (Mild, bilateral legs) and headaches. Negative for seizures, speech difficulty and numbness.  ? ?OBJECTIVE ?Patient Vitals for the past 24 hrs: ? BP Temp Temp src Pulse Resp SpO2  ?06/19/21 1823 135/72 98.5 ?F (36.9 ?C) Oral 60 18 99 %  ? ?Constitutional: Well-developed, well-nourished female in mild distress when lying down.  Tearful. ?Cardiovascular: normal rate ?Respiratory: normal rate and effort.  ?GI: Deferred ?Neurologic: Alert and oriented x 4.  ?Deferred ? ?LAB RESULTS ?No results found for this or any previous visit (from the past 24 hour(s)). ? ?IMAGING ?No results found. ? ?MAU COURSE ?Orders Placed This Encounter  ?Procedures  ? Insert peripheral IV  ? ?Meds ordered this encounter  ?Medications  ? caffeine-sodium benzoate ADULT 500 mg in sodium chloride 0.9 % 1,000 mL IVPB  ? lactated ringers bolus 1,000 mL  ? metoCLOPramide (REGLAN) injection 10 mg  ? ?Consulted Dr. Salvadore Farber, [facility] anesthesiologist.  Will come see patient and discuss possible blood patch. ? ?Care of pt turned over to Venia Carbon, NP at 2011.  ? ?Katrinka Blazing, [place], CNM ?06/19/2021  ?8:12 PM ? ? ?Resumed care of the patient at 2011. ?RN reports patient is asking to leaving. She now rates her pain 0/10 while sitting and laying flat. ?Caffeine infusion complete.  ?One elevated BP during blood patch placement, subsequent and prior BP normal.  ? ? ?Duane Lope, NP ?06/19/2021 ?8:59 PM ? ? ?

## 2021-06-19 NOTE — Anesthesia Preprocedure Evaluation (Signed)
Anesthesia Evaluation  ?Patient identified by MRN, date of birth, ID band ?Patient awake ? ? ? ?Reviewed: ?Allergy & Precautions, Patient's Chart, lab work & pertinent test results ? ?History of Anesthesia Complications ?(+) POST - OP SPINAL HEADACHE and history of anesthetic complications ? ?Airway ?Mallampati: III ? ?TM Distance: >3 FB ?Neck ROM: Full ? ? ? Dental ?no notable dental hx. ? ?  ?Pulmonary ?asthma , former smoker,  ?  ?Pulmonary exam normal ?breath sounds clear to auscultation ? ? ? ? ? ? Cardiovascular ?negative cardio ROS ?Normal cardiovascular exam ?Rhythm:Regular Rate:Normal ? ? ?  ?Neuro/Psych ? Headaches, Spinal headache ?negative psych ROS  ? GI/Hepatic ?negative GI ROS, Neg liver ROS,   ?Endo/Other  ?diabetes, Gestational, Oral Hypoglycemic AgentsMorbid obesityBMI 53 ? Renal/GU ?negative Renal ROS  ?negative genitourinary ?  ?Musculoskeletal ?negative musculoskeletal ROS ?(+)  ? Abdominal ?(+) + obese,   ?Peds ?negative pediatric ROS ?(+)  Hematology ?negative hematology ROS ?(+) Labs 2d ago plt 287, hb 10.6   ?Anesthesia Other Findings ? ? Reproductive/Obstetrics ?3 days post-partum, known wet tap at that time- presents to MAU with 9/10 headache positional in nature ? ?  ? ? ? ? ? ? ? ? ? ? ? ? ? ?  ?  ? ? ? ? ? ? ? ? ?Anesthesia Physical ?Anesthesia Plan ? ?ASA: 3 ? ?Anesthesia Plan: Epidural  ? ?Post-op Pain Management:   ? ?Induction:  ? ?PONV Risk Score and Plan: 2 ? ?Airway Management Planned: Natural Airway ? ?Additional Equipment: None ? ?Intra-op Plan:  ? ?Post-operative Plan:  ? ?Informed Consent: I have reviewed the patients History and Physical, chart, labs and discussed the procedure including the risks, benefits and alternatives for the proposed anesthesia with the patient or authorized representative who has indicated his/her understanding and acceptance.  ? ? ? ? ? ?Plan Discussed with:  ? ?Anesthesia Plan Comments: (Epidural blood patch in  MAU)  ? ? ? ? ? ? ?Anesthesia Quick Evaluation ? ?

## 2021-06-19 NOTE — MAU Note (Addendum)
Sat patient up and has no headache. Notified Carol Zhang ?

## 2021-06-19 NOTE — Anesthesia Procedure Notes (Signed)
Epidural ?Patient location during procedure: OB ?Start time: 06/19/2021 7:35 PM ?End time: 06/19/2021 7:40 PM ? ?Staffing ?Anesthesiologist: Lannie Fields, DO ?Performed: anesthesiologist  ? ?Preanesthetic Checklist ?Completed: patient identified, IV checked, risks and benefits discussed, monitors and equipment checked, pre-op evaluation and timeout performed ? ?Epidural ?Patient position: sitting ?Prep: DuraPrep and site prepped and draped ?Patient monitoring: continuous pulse ox, blood pressure, heart rate and cardiac monitor ?Approach: midline ?Location: L3-L4 ?Injection technique: LOR air ? ?Needle:  ?Needle type: Tuohy  ?Needle gauge: 17 G ?Needle length: 9 cm ?Needle insertion depth: 8 cm ? ?Assessment ?Events: blood not aspirated, injection not painful, no injection resistance, no paresthesia and negative IV test ? ?Additional Notes ?Blood patch placed in MAU after known wet tap. R AC sterile blood obtained, 69mL injected into epidural space. Good pain relief post-epidural. Reason for block:Epidural Blood Patch ? ? ? ?

## 2021-06-25 ENCOUNTER — Telehealth (HOSPITAL_COMMUNITY): Payer: Self-pay | Admitting: *Deleted

## 2021-06-25 NOTE — Telephone Encounter (Signed)
Mom reports feeling good. No concerns about herself at this time. EPDS=0 [facility] score=0) ?Mom reports baby is doing well. Feeding, peeing, and pooping without difficulty. Safe sleep reviewed. Mom reports no concerns about baby at present. ? ?Duffy Rhody, RN 06-25-2021 at 12:47pm ?

## 2023-10-12 ENCOUNTER — Encounter (INDEPENDENT_AMBULATORY_CARE_PROVIDER_SITE_OTHER): Payer: Self-pay

## 2023-11-27 NOTE — Progress Notes (Unsigned)
 [address] [place], [place], [place] [address] Office: [phone]  /  Fax: [phone]   Initial Consultation    Carol Zhang was seen in clinic today to evaluate for obesity. She is interested in losing weight to improve overall health and reduce the risk of weight related complications. She presents today to review program treatment options, initial physical assessment, and evaluation.    [name] does have a history of prediabetes and Vit D deficiency.  She is currently on no medication.  Anthropometrics and Bioimpedance Analysis   Body mass index is 44.45 kg/m. Body Fat Mass : 47.1 % Visceral Fat Mass Rating : 12   Obesity Related Diseases and Complications  Obesity Quality of Life and Psychosocial Complications: Body image dissatisfaction, Reduced health-related quality of life, and Decrease physical activity and social participation  Cardiometabolic: Prediabetes and/or insulin resistance, DOE, and Fatigue  Biomechanical: Asthma   Weight Related History  She was referred by: PCP  When asked what they would like to accomplish? She states: Adopt a healthier eating pattern and lifestyle, Improve energy levels and physical activity, Improve existing medical conditions, Improve quality of life, Improve appearance, and Improve self-confidence  Weight history: She noticed weight issues 280starting as a child  Highest weight: 280  Contributing factors: family history of obesity, consumption of processed foods, moderate to high levels of stress, reduced physical activity, strong orexigenic signaling and/or inadequate inhibitory control , multiple weight loss attempts in the past, hectic pace of life, and need for convenient foods  Prior weight loss attempts: Low Carb  Current or previous pharmacotherapy: Phentermine  Response to medication: Lost weight initially but was unable to sustain weight loss  Current nutrition plan: Low-carb and High-protein  Greatest challenge  with dieting: Cravings, maintaining low calorie.  Current level of physical activity: Walking 30 minutes, seven a week  Barriers to Exercise: time  Readiness and Motivation  On a scale from 0 to 10 How ready are you to make changes to your eating and physical activity to lose weight? 10 How important is it for you to lose weight right now ? 10 How confident are you that you can lose weight if you try? 10  Past Medical History   Past Medical History:  Diagnosis Date   Asthma    Gestational diabetes    glyburide      Objective    BP 125/78   Pulse 66   Temp 98.4 F (36.9 C)   Ht 5' 2 (1.575 m)   Wt 243 lb (110.2 kg)   LMP 11/27/2023 (Exact Date)   SpO2 99%   BMI 44.45 kg/m  She was weighed on the bioimpedance scale: Body mass index is 44.45 kg/m.    General:  Alert, oriented and cooperative. Patient is in no acute distress.  Respiratory: Normal respiratory effort, no problems with respiration noted   Gait: able to ambulate independently  Mental Status: Normal mood and affect. Normal behavior. Normal judgment and thought content.   Diagnostic Data Reviewed  BMET    Component Value Date/Time   NA 136 11/05/2020 2123   K 3.9 11/05/2020 2123   CL 103 11/05/2020 2123   CO2 23 11/05/2020 2123   GLUCOSE 90 11/05/2020 2123   BUN 7 11/05/2020 2123   CREATININE 0.72 11/05/2020 2123   CALCIUM 9.1 11/05/2020 2123   GFRNONAA >60 11/05/2020 2123   GFRAA >60 06/05/2019 1324   Lab Results  Component Value Date   HGBA1C 4.9 08/15/2016   No results found  for: INSULIN CBC    Component Value Date/Time   WBC 13.5 (H) 06/17/2021 0622   RBC 4.21 06/17/2021 0622   HGB 10.6 (L) 06/17/2021 0622   HGB 11.3 12/05/2016 1010   HCT 33.8 (L) 06/17/2021 0622   HCT 36.1 12/05/2016 1010   PLT 287 06/17/2021 0622   PLT 292 12/05/2016 1010   MCV 80.3 06/17/2021 0622   MCV 90 12/05/2016 1010   MCH 25.2 (L) 06/17/2021 0622   MCHC 31.4 06/17/2021 0622   RDW 20.9 (H) 06/17/2021  0622   RDW 13.8 12/05/2016 1010    Hepatic Function Panel     Component Value Date/Time   PROT 6.9 11/05/2020 2123   ALBUMIN 3.7 11/05/2020 2123   AST 17 11/05/2020 2123   ALT 17 11/05/2020 2123   ALKPHOS 55 11/05/2020 2123   BILITOT 0.6 11/05/2020 2123    Medications  Outpatient Encounter Medications as of 11/28/2023  Medication Sig   acetaminophen  (TYLENOL ) 325 MG tablet Take 2 tablets (650 mg total) by mouth every 4 (four) hours as needed (for pain scale < 4).   albuterol  (PROVENTIL  HFA;VENTOLIN  HFA) 108 (90 BASE) MCG/ACT inhaler Inhale 2 puffs into the lungs every 6 (six) hours as needed for wheezing or shortness of breath.   albuterol  (PROVENTIL ) (2.5 MG/3ML) 0.083% nebulizer solution Take 2.5 mg by nebulization every 6 (six) hours as needed for wheezing or shortness of breath.   ibuprofen  (ADVIL ) 600 MG tablet Take 1 tablet (600 mg total) by mouth every 6 (six) hours.   No facility-administered encounter medications on file as of 11/28/2023.     Assessment and Plan   Prediabetes  Vitamin D  deficiency  Class 3 severe obesity with serious comorbidity and body mass index (BMI) of 40.0 to 44.9 in adult, unspecified obesity type (HCC)       Obesity Treatment and Action Plan:  Patient will work on garnering support from family and friends to begin weight loss journey. Will work on eliminating or reducing the presence of highly palatable, calorie dense foods in the home. Will complete provided nutritional and psychosocial assessment questionnaire before the next appointment. Will be scheduled for indirect calorimetry to determine resting energy expenditure in a fasting state.  This will allow us  to create a reduced calorie, high-protein meal plan to promote loss of fat mass while preserving muscle mass. Counseled on the health benefits of losing 5%-15% of total body weight. Was counseled on nutritional approaches to weight loss and benefits of reducing processed foods and  consuming plant-based foods and high quality protein as part of nutritional weight management. Was counseled on pharmacotherapy and role as an adjunct in weight management.   Education and Additional resources  She was weighed on the bioimpedance scale and results were discussed and documented in the synopsis.  We discussed obesity as a progressive, chronic disease and the importance of a more detailed evaluation of all the factors contributing to the disease.  We reviewed the basic principles in obesity management.   We discussed the importance of long term lifestyle changes which include nutrition, exercise and behavioral modification as well as the importance of customizing this to her specific health and social needs.  We reviewed the role of medical interventions including pharmacotherapy and surgical interventions.   We discussed the benefits of reaching a healthier weight to alleviate the symptoms of existing conditions and reduce the risks of the biomechanical, cardiometabolic and psychological effects of obesity.  We reviewed our program approach and philosophy, which  are guided by the four pillars of obesity medicine.  We discussed how to prepare for intake appointment and the importance of fasting and avoidance of stimulants for at least 8 hours prior to indirect calorimetry.  Elenor LITTIE Ferry appears to be in the action stage of change and reports being ready to initiate intensive lifestyle and behavioral modifications as part of their weight loss journey.  Attestation  Reviewed by clinician on day of visit: allergies, medications, problem list, medical history, surgical history, family history, social history, and previous encounter notes pertinent to obesity diagnosis.  I have spent 31 minutes in the care of the patient today including: 11 minutes before the visit reviewing and preparing the chart. 13 minutes face-to-face assessing and reviewing listed medical problems as  outlined in obesity care plan, providing nutritional and behavioral counseling on topics outlined in the obesity care plan, independently interpreting test results and goals of care, as described in assessment and plan, reviewing and discussing biometric information and progress, reviewing latest PCP notes and specialist consultations, and managing referral  7 minutes after the visit updating chart and documentation of encounter.  Adalyn Pennock ANP-C

## 2023-11-28 ENCOUNTER — Ambulatory Visit (INDEPENDENT_AMBULATORY_CARE_PROVIDER_SITE_OTHER): Payer: Self-pay | Admitting: Nurse Practitioner

## 2023-11-28 ENCOUNTER — Encounter (INDEPENDENT_AMBULATORY_CARE_PROVIDER_SITE_OTHER): Payer: Self-pay | Admitting: Nurse Practitioner

## 2023-11-28 VITALS — BP 125/78 | HR 66 | Temp 98.4°F | Ht 62.0 in | Wt 243.0 lb

## 2023-11-28 DIAGNOSIS — E559 Vitamin D deficiency, unspecified: Secondary | ICD-10-CM | POA: Diagnosis not present

## 2023-11-28 DIAGNOSIS — E66813 Obesity, class 3: Secondary | ICD-10-CM

## 2023-11-28 DIAGNOSIS — J4520 Mild intermittent asthma, uncomplicated: Secondary | ICD-10-CM

## 2023-11-28 DIAGNOSIS — Z6841 Body Mass Index (BMI) 40.0 and over, adult: Secondary | ICD-10-CM

## 2023-11-28 DIAGNOSIS — R7303 Prediabetes: Secondary | ICD-10-CM | POA: Diagnosis not present
